# Patient Record
Sex: Female | Born: 1955 | Race: White | Hispanic: No | Marital: Married | Smoking: Never smoker
Health system: Southern US, Community
[De-identification: ages and names within clinical notes are randomized; demographics above are authoritative.]

## PROBLEM LIST (undated history)

## (undated) DIAGNOSIS — M545 Low back pain: Secondary | ICD-10-CM

## (undated) DIAGNOSIS — M1990 Unspecified osteoarthritis, unspecified site: Secondary | ICD-10-CM

## (undated) DIAGNOSIS — D649 Anemia, unspecified: Secondary | ICD-10-CM

## (undated) DIAGNOSIS — M549 Dorsalgia, unspecified: Secondary | ICD-10-CM

## (undated) DIAGNOSIS — H269 Unspecified cataract: Secondary | ICD-10-CM

## (undated) DIAGNOSIS — T7840XA Allergy, unspecified, initial encounter: Secondary | ICD-10-CM

## (undated) HISTORY — PX: BACK SURGERY: SHX140

## (undated) HISTORY — DX: Unspecified cataract: H26.9

## (undated) HISTORY — PX: TONSILLECTOMY AND ADENOIDECTOMY: SUR1326

## (undated) HISTORY — DX: Unspecified osteoarthritis, unspecified site: M19.90

## (undated) HISTORY — PX: BILATERAL OOPHORECTOMY: SHX1221

## (undated) HISTORY — DX: Anemia, unspecified: D64.9

## (undated) HISTORY — DX: Low back pain: M54.5

## (undated) HISTORY — DX: Allergy, unspecified, initial encounter: T78.40XA

## (undated) HISTORY — DX: Dorsalgia, unspecified: M54.9

---

## 2003-03-24 HISTORY — PX: ABDOMINAL HYSTERECTOMY: SHX81

## 2004-03-23 HISTORY — PX: COLONOSCOPY: SHX174

## 2009-04-08 ENCOUNTER — Ambulatory Visit: Payer: Self-pay | Admitting: Internal Medicine

## 2009-04-08 DIAGNOSIS — J309 Allergic rhinitis, unspecified: Secondary | ICD-10-CM | POA: Insufficient documentation

## 2009-04-08 DIAGNOSIS — D649 Anemia, unspecified: Secondary | ICD-10-CM

## 2009-04-08 DIAGNOSIS — M545 Low back pain, unspecified: Secondary | ICD-10-CM

## 2009-04-08 DIAGNOSIS — M1990 Unspecified osteoarthritis, unspecified site: Secondary | ICD-10-CM

## 2009-04-08 HISTORY — DX: Low back pain, unspecified: M54.50

## 2009-04-08 HISTORY — DX: Anemia, unspecified: D64.9

## 2009-04-08 HISTORY — DX: Unspecified osteoarthritis, unspecified site: M19.90

## 2009-04-08 LAB — CONVERTED CEMR LAB
Alkaline Phosphatase: 62 units/L (ref 39–117)
Basophils Relative: 0.4 % (ref 0.0–3.0)
Bilirubin, Direct: 0.1 mg/dL (ref 0.0–0.3)
CO2: 29 meq/L (ref 19–32)
Calcium: 9.3 mg/dL (ref 8.4–10.5)
Creatinine, Ser: 0.7 mg/dL (ref 0.4–1.5)
Eosinophils Absolute: 0.1 10*3/uL (ref 0.0–0.7)
GFR calc non Af Amer: 124.45 mL/min (ref >60)
HDL: 60.8 mg/dL (ref >39.00)
Hemoglobin: 12.5 g/dL — ABNORMAL LOW (ref 13.0–17.0)
LDL Cholesterol: 98 mg/dL (ref 0–99)
Lymphocytes Relative: 42.4 % (ref 12.0–46.0)
MCHC: 33 g/dL (ref 30.0–36.0)
Monocytes Relative: 7.9 % (ref 3.0–12.0)
Neutrophils Relative %: 47.2 % (ref 43.0–77.0)
RBC: 4.02 M/uL — ABNORMAL LOW (ref 4.22–5.81)
Sodium: 144 meq/L (ref 135–145)
Total CHOL/HDL Ratio: 3
Total Protein: 7.5 g/dL (ref 6.0–8.3)
Triglycerides: 38 mg/dL (ref 0.0–149.0)
VLDL: 7.6 mg/dL (ref 0.0–40.0)

## 2009-04-15 ENCOUNTER — Ambulatory Visit (HOSPITAL_COMMUNITY): Admission: RE | Admit: 2009-04-15 | Discharge: 2009-04-15 | Payer: Self-pay | Admitting: Internal Medicine

## 2010-04-16 ENCOUNTER — Ambulatory Visit (HOSPITAL_COMMUNITY)
Admission: RE | Admit: 2010-04-16 | Discharge: 2010-04-16 | Payer: Self-pay | Source: Home / Self Care | Attending: Internal Medicine | Admitting: Internal Medicine

## 2010-04-17 ENCOUNTER — Ambulatory Visit
Admission: RE | Admit: 2010-04-17 | Discharge: 2010-04-17 | Payer: Self-pay | Source: Home / Self Care | Attending: Internal Medicine | Admitting: Internal Medicine

## 2010-04-17 ENCOUNTER — Other Ambulatory Visit: Payer: Self-pay | Admitting: Internal Medicine

## 2010-04-17 LAB — CBC WITH DIFFERENTIAL/PLATELET
Basophils Relative: 0.4 % (ref 0.0–3.0)
Eosinophils Relative: 2.5 % (ref 0.0–5.0)
HCT: 36.6 % (ref 36.0–46.0)
Hemoglobin: 12.4 g/dL (ref 12.0–15.0)
MCV: 93.4 fl (ref 78.0–100.0)
Monocytes Absolute: 0.3 10*3/uL (ref 0.1–1.0)
Neutrophils Relative %: 42.7 % — ABNORMAL LOW (ref 43.0–77.0)
RBC: 3.92 Mil/uL (ref 3.87–5.11)
WBC: 3.4 10*3/uL — ABNORMAL LOW (ref 4.5–10.5)

## 2010-04-17 LAB — BASIC METABOLIC PANEL
Calcium: 9 mg/dL (ref 8.4–10.5)
GFR: 105.83 mL/min (ref >60.00)
Potassium: 4.2 mEq/L (ref 3.5–5.1)
Sodium: 139 mEq/L (ref 135–145)

## 2010-04-17 LAB — CONVERTED CEMR LAB
Bilirubin Urine: NEGATIVE
Glucose, Urine, Semiquant: NEGATIVE
Ketones, urine, test strip: NEGATIVE

## 2010-04-17 LAB — LIPID PANEL
LDL Cholesterol: 89 mg/dL (ref 0–99)
Total CHOL/HDL Ratio: 3
Triglycerides: 42 mg/dL (ref 0.0–149.0)

## 2010-04-17 LAB — HEPATIC FUNCTION PANEL
AST: 23 U/L (ref 0–37)
Alkaline Phosphatase: 71 U/L (ref 39–117)
Total Bilirubin: 0.8 mg/dL (ref 0.3–1.2)

## 2010-04-22 NOTE — Assessment & Plan Note (Signed)
Summary: TOA BE EST/PT WILL COME IN FASTING/NJR   Vital Signs:  Patient profile:   55 year old female Height:      66.5 inches Weight:      143 pounds BMI:     22.82 Pulse rate:   84 / minute Pulse rhythm:   regular BP sitting:   100 / 66  (left arm) Cuff size:   regular  Vitals Entered By: Raechel Ache, RN (April 08, 2009 9:38 AM) CC: To Establish, wants check-up.   CC:  To Establish and wants check-up.Marland Kitchen  History of Present Illness: 55 year old patient who is in today to establish with our practice.  She has enjoyed excellent health and has no concerns or complaints.  She takes no chronic medications.  Preventive Screening-Counseling & Management  Alcohol-Tobacco     Smoking Status: never  Allergies (verified): No Known Drug Allergies  Past History:  Past Medical History: Low back pain Osteoarthritis scoliosis status post surgery Anemia-NOS Allergic rhinitis history of condyloma acuminata  Past Surgical History: Harrington rod for scoliosis, 1973 Tonsillectomy 1963 gravida two, para two, abortus zero hysterectomy 2005 ('Ovarian tumor" -cervix remains) blood transfusion 1973  colonoscopy 2008 mammogram 2007  Family History: Reviewed history and no changes required. father age 50.  History BPH mother died mid-70s history of liver cancer maternal grandmother, unclear GI cancer maternal grandfather cerebral vascular and coronary artery disease  one brother one adopted sister in good health  Social History: Reviewed history and no changes required. Married Never Smoked relocated from [place] September 2010 andSmoking Status:  never  Review of Systems  The patient denies anorexia, fever, weight loss, weight gain, vision loss, decreased hearing, hoarseness, chest pain, syncope, dyspnea on exertion, peripheral edema, prolonged cough, headaches, hemoptysis, abdominal pain, melena, hematochezia, severe indigestion/heartburn, hematuria, incontinence,  genital sores, muscle weakness, suspicious skin lesions, transient blindness, difficulty walking, depression, unusual weight change, abnormal bleeding, enlarged lymph nodes, angioedema, and breast masses.    Physical Exam  General:  Well-developed,well-nourished,in no acute distress; alert,appropriate and cooperative throughout examination Head:  Normocephalic and atraumatic without obvious abnormalities. No apparent alopecia or balding. Eyes:  No corneal or conjunctival inflammation noted. EOMI. Perrla. Funduscopic exam benign, without hemorrhages, exudates or papilledema. Vision grossly normal. Ears:  External ear exam shows no significant lesions or deformities.  Otoscopic examination reveals clear canals, tympanic membranes are intact bilaterally without bulging, retraction, inflammation or discharge. Hearing is grossly normal bilaterally. Mouth:  Oral mucosa and oropharynx without lesions or exudates.  Teeth in good repair. Neck:  No deformities, masses, or tenderness noted. Chest Wall:  No deformities, masses, tenderness or gynecomastia noted. Breasts:  No masses or gynecomastia noted Lungs:  Normal respiratory effort, chest expands symmetrically. Lungs are clear to auscultation, no crackles or wheezes. Heart:  Normal rate and regular rhythm. S1 and S2 normal without gallop, murmur, click, rub or other extra sounds. Abdomen:  Bowel sounds positive,abdomen soft and non-tender without masses, organomegaly or hernias noted. Msk:  No deformity or scoliosis noted of thoracic or lumbar spine.   Pulses:  R and L carotid,radial,femoral,dorsalis pedis and posterior tibial pulses are full and equal bilaterally Extremities:  No clubbing, cyanosis, edema, or deformity noted with normal full range of motion of all joints.   Skin:  surgical scar, thoracic and lumbar spine Cervical Nodes:  No lymphadenopathy noted Axillary Nodes:  No palpable lymphadenopathy Inguinal Nodes:  No significant  adenopathy Psych:  Cognition and judgment appear intact. Alert and cooperative with normal  attention span and concentration. No apparent delusions, illusions, hallucinations   Impression & Recommendations:  Problem # 1:  ALLERGIC RHINITIS (ICD-477.9)  Orders: Venipuncture (16109) TLB-Lipid Panel (80061-LIPID) TLB-BMP (Basic Metabolic Panel-BMET) (80048-METABOL) TLB-Hepatic/Liver Function Pnl (80076-HEPATIC) TLB-TSH (Thyroid Stimulating Hormone) (84443-TSH)  Problem # 2:  ANEMIA-NOS (ICD-285.9)  Orders: Venipuncture (60454) TLB-Lipid Panel (80061-LIPID) TLB-BMP (Basic Metabolic Panel-BMET) (80048-METABOL) TLB-CBC Platelet - w/Differential (85025-CBCD) TLB-Hepatic/Liver Function Pnl (80076-HEPATIC) TLB-TSH (Thyroid Stimulating Hormone) (84443-TSH)  Problem # 3:  OSTEOARTHRITIS (ICD-715.90)  Orders: Venipuncture (09811) TLB-Lipid Panel (80061-LIPID) TLB-BMP (Basic Metabolic Panel-BMET) (80048-METABOL) TLB-Hepatic/Liver Function Pnl (80076-HEPATIC) TLB-TSH (Thyroid Stimulating Hormone) (84443-TSH)  Problem # 4:  LOW BACK PAIN (ICD-724.2)  Orders: Venipuncture (91478) TLB-Lipid Panel (80061-LIPID) TLB-BMP (Basic Metabolic Panel-BMET) (80048-METABOL) TLB-Hepatic/Liver Function Pnl (80076-HEPATIC) TLB-TSH (Thyroid Stimulating Hormone) (84443-TSH)  Complete Medication List: 1)  Famvir 250 Mg Tabs (Famciclovir) .... One twice daily for 3 days  Patient Instructions: 1)  Limit your Sodium (Salt). 2)  It is important that you exercise regularly at least 20 minutes 5 times a week. If you develop chest pain, have severe difficulty breathing, or feel very tired , stop exercising immediately and seek medical attention. 3)  Schedule your mammogram. Prescriptions: FAMVIR 250 MG TABS (FAMCICLOVIR) one twice daily for 3 days  #12 x 4   Entered and Authorized by:   Gordy Savers  MD   Signed by:   Gordy Savers  MD on 04/08/2009   Method used:   Print then Give to  Patient   RxID:   [id number]  Of the  Immunization History:  Tetanus/Td Immunization History:    Tetanus/Td:  td (03/24/2006)  Pneumovax Immunization History:    Pneumovax:  pneumovax (03/25/2007)

## 2010-05-05 ENCOUNTER — Encounter: Payer: Self-pay | Admitting: Internal Medicine

## 2010-05-06 ENCOUNTER — Ambulatory Visit (INDEPENDENT_AMBULATORY_CARE_PROVIDER_SITE_OTHER): Payer: PRIVATE HEALTH INSURANCE | Admitting: Internal Medicine

## 2010-05-06 ENCOUNTER — Encounter: Payer: Self-pay | Admitting: Internal Medicine

## 2010-05-06 ENCOUNTER — Other Ambulatory Visit (HOSPITAL_COMMUNITY)
Admission: RE | Admit: 2010-05-06 | Discharge: 2010-05-06 | Disposition: A | Discharge status: Home / Self Care | Payer: PRIVATE HEALTH INSURANCE | Source: Ambulatory Visit | Attending: Internal Medicine | Admitting: Internal Medicine

## 2010-05-06 VITALS — BP 110/66 | HR 100 | Temp 97.9°F | Resp 16 | Ht 65.5 in | Wt 148.0 lb

## 2010-05-06 DIAGNOSIS — Z0000 Encounter for general adult medical examination without abnormal findings: Secondary | ICD-10-CM

## 2010-05-06 DIAGNOSIS — Z01419 Encounter for gynecological examination (general) (routine) without abnormal findings: Secondary | ICD-10-CM | POA: Insufficient documentation

## 2010-05-06 MED ORDER — FAMCICLOVIR 250 MG PO TABS: 250.0000 mg | ORAL_TABLET | Freq: Two times a day (BID) | ORAL | Status: DC

## 2010-05-06 NOTE — Patient Instructions (Signed)
It is important that you exercise regularly, at least 20 minutes 3 to 4 times per week.  If you develop chest pain or shortness of breath seek  medical attention.  Return in one year for follow-up   

## 2010-05-06 NOTE — Progress Notes (Signed)
  Subjective:    Patient ID: Savannah Fowler, female    DOB: [birth year], 55 y.o.   MRN: [id number]  HPI  55 year old patient who is seen today for a conferences examination.  She enjoys excellent health.  She does have a history of scoliosis and is status post extensive back surgery in the past.  She does have some occasional low back pain.  She has had a partial hysterectomy and a history of cervical warts.  She is abnormal since the mid-90s.  Her last Pap was approximately 3 years ago   Review of Systems  Constitutional: Negative for fever, appetite change, fatigue and unexpected weight change.  HENT: Negative for hearing loss, ear pain, nosebleeds, congestion, sore throat, mouth sores, trouble swallowing, neck stiffness, dental problem, voice change, sinus pressure and tinnitus.   Eyes: Negative for photophobia, pain, redness and visual disturbance.  Respiratory: Negative for cough, chest tightness and shortness of breath.   Cardiovascular: Negative for chest pain, palpitations and leg swelling.  Gastrointestinal: Negative for nausea, vomiting, abdominal pain, diarrhea, constipation, blood in stool, abdominal distention and rectal pain.  Genitourinary: Negative for dysuria, urgency, frequency, hematuria, flank pain, vaginal bleeding, vaginal discharge, difficulty urinating, genital sores, vaginal pain, menstrual problem and pelvic pain.  Musculoskeletal: Positive for back pain. Negative for arthralgias.  Skin: Negative for rash.  Neurological: Negative for dizziness, syncope, speech difficulty, weakness, light-headedness, numbness and headaches.  Hematological: Negative for adenopathy. Does not bruise/bleed easily.  Psychiatric/Behavioral: Negative for suicidal ideas, behavioral problems, self-injury, dysphoric mood and agitation. The patient is not nervous/anxious.        Objective:   Physical Exam  Constitutional: She is oriented to person, place, and time. She appears well-developed and  well-nourished.  HENT:  Head: Normocephalic and atraumatic.  Right Ear: External ear normal.  Left Ear: External ear normal.  Mouth/Throat: Oropharynx is clear and moist.  Eyes: Conjunctivae and EOM are normal.  Neck: Normal range of motion. Neck supple. No JVD present. No thyromegaly present.  Cardiovascular: Normal rate, regular rhythm, normal heart sounds and intact distal pulses.   No murmur heard. Pulmonary/Chest: Effort normal and breath sounds normal. She has no wheezes. She has no rales.  Abdominal: Soft. Bowel sounds are normal. She exhibits no distension and no mass. There is no tenderness. There is no rebound and no guarding.  Genitourinary: Vagina normal. Guaiac negative stool. No vaginal discharge found.       Cervical remnant .  Pap  specimen obtained  Musculoskeletal: Normal range of motion. She exhibits no edema and no tenderness.  Neurological: She is alert and oriented to person, place, and time. She has normal reflexes. No cranial nerve deficit. She exhibits normal muscle tone. Coordination normal.  Skin: Skin is warm and dry. No rash noted.  Psychiatric: She has a normal mood and affect. Her behavior is normal.          Assessment & Plan:  Unremarkable preventive health examination

## 2010-05-14 ENCOUNTER — Ambulatory Visit (INDEPENDENT_AMBULATORY_CARE_PROVIDER_SITE_OTHER): Payer: PRIVATE HEALTH INSURANCE | Admitting: Internal Medicine

## 2010-05-14 ENCOUNTER — Encounter: Payer: Self-pay | Admitting: Internal Medicine

## 2010-05-14 VITALS — BP 118/80 | Temp 97.6°F | Ht 65.5 in | Wt 148.0 lb

## 2010-05-14 DIAGNOSIS — L259 Unspecified contact dermatitis, unspecified cause: Secondary | ICD-10-CM

## 2010-05-14 DIAGNOSIS — L309 Dermatitis, unspecified: Secondary | ICD-10-CM

## 2010-05-14 NOTE — Progress Notes (Signed)
  Subjective:    Patient ID: Savannah Fowler, female    DOB: [birth year], 55 y.o.   MRN: [id number]  HPI   55 year old patient who is seen today with a chief complaint of a erythematous rash just lateral to her right eye. She was concerned about spread of the rash to the eye itself. History is pertinent in that she has recently used a new eyeliner. She does have a history of allergic rhinitis    Review of Systems  Constitutional: Negative.   HENT: Negative.   Eyes: Negative for photophobia, pain, discharge, redness, itching and visual disturbance.        Slight erythema just lateral to the lateral border of the right eye       Objective:   Physical Exam  Constitutional: She appears well-developed.  HENT:  Head: Normocephalic and atraumatic.  Right Ear: External ear normal.  Left Ear: External ear normal.  Mouth/Throat: Oropharynx is clear and moist.  Eyes: Conjunctivae and EOM are normal. Pupils are equal, round, and reactive to light. Right eye exhibits no discharge. Left eye exhibits no discharge. No scleral icterus.        Patchy areas erythema just lateral to the lateral border of the right eye          Assessment & Plan:   facial dermatitis possibly secondary to the use of a new eyeliner. The eyeliner and all makeup will be discontinued at the present time she will apply a gentle moisturizing cream and observe. She will call if the dermatitis worsens

## 2010-05-14 NOTE — Patient Instructions (Signed)
Call or return to clinic prn if these symptoms worsen or fail to improve as anticipated. Avoid all makeup until dermatitis has resolved

## 2011-04-06 ENCOUNTER — Other Ambulatory Visit: Payer: Self-pay | Admitting: Internal Medicine

## 2011-04-06 DIAGNOSIS — Z1231 Encounter for screening mammogram for malignant neoplasm of breast: Secondary | ICD-10-CM

## 2011-04-19 ENCOUNTER — Emergency Department (HOSPITAL_COMMUNITY)
Admission: EM | Admit: 2011-04-19 | Discharge: 2011-04-19 | Disposition: A | Discharge status: Home / Self Care | Payer: 59 | Attending: Emergency Medicine | Admitting: Emergency Medicine

## 2011-04-19 DIAGNOSIS — W292XXA Contact with other powered household machinery, initial encounter: Secondary | ICD-10-CM | POA: Insufficient documentation

## 2011-04-19 DIAGNOSIS — S61213A Laceration without foreign body of left middle finger without damage to nail, initial encounter: Secondary | ICD-10-CM

## 2011-04-19 DIAGNOSIS — S61209A Unspecified open wound of unspecified finger without damage to nail, initial encounter: Secondary | ICD-10-CM | POA: Insufficient documentation

## 2011-04-19 MED ORDER — TETANUS-DIPHTH-ACELL PERTUSSIS 5-2.5-18.5 LF-MCG/0.5 IM SUSP
INTRAMUSCULAR | Status: AC
  Administered 2011-04-19: 0.5 mL via INTRAMUSCULAR
  Filled 2011-04-19: qty 0.5

## 2011-04-19 MED ORDER — TETANUS-DIPHTHERIA TOXOIDS TD 5-2 LFU IM INJ
0.5000 mL | INJECTION | Freq: Once | INTRAMUSCULAR | Status: DC
  Filled 2011-04-19: qty 0.5

## 2011-04-19 MED ORDER — HYDROCODONE-ACETAMINOPHEN 5-325 MG PO TABS: 1.0000 | ORAL_TABLET | ORAL | Status: AC | PRN

## 2011-04-19 MED ORDER — "THROMBI-PAD 3""X3"" EX PADS"
MEDICATED_PAD | CUTANEOUS | Status: AC
  Administered 2011-04-19: 15:00:00 via TOPICAL
  Filled 2011-04-19: qty 1

## 2011-04-19 NOTE — ED Provider Notes (Signed)
History     CSN: [id number]  Arrival date & time 04/19/11  1201   First MD Initiated Contact with Patient 04/19/11 1314      No chief complaint on file.   (Consider location/radiation/quality/duration/timing/severity/associated sxs/prior treatment) HPI History provided by pt.   Pt accidentally cut her left index finger on blade of food processor just pta.  C/o severe pain.  No paresthesias.  Most recent tetanus 36yrs ago.    Past Medical History  Diagnosis Date  . ANEMIA-NOS 04/08/2009  . LOW BACK PAIN 04/08/2009  . OSTEOARTHRITIS 04/08/2009  . Allergy   . Osteoarthritis   . Back pain     Past Surgical History  Procedure Date  . Cesarean section   . Back surgery   . Tonsillectomy and adenoidectomy age 36   . Bilateral oophorectomy   . Abdominal hysterectomy 2005    [place], Fibroids, cervical remnant    Family History  Problem Relation Age of Onset  . Osteoporosis Father     History  Substance Use Topics  . Smoking status: Never Smoker   . Smokeless tobacco: Not on file  . Alcohol Use: Not on file    OB History    Grav Para Term Preterm Abortions TAB SAB Ect Mult Living                  Review of Systems  All other systems reviewed and are negative.    Allergies  Review of patient's allergies indicates no known allergies.  Home Medications   Current Outpatient Rx  Name Route Sig Dispense Refill  . VITAMIN D 1000 UNITS PO TABS Oral Take 1,000 Units by mouth daily.    Marland Kitchen LORATADINE 10 MG PO TABS Oral Take 10 mg by mouth daily.    . ADULT MULTIVITAMIN W/MINERALS CH Oral Take 1 tablet by mouth daily.    Marland Kitchen VITAMIN C 500 MG PO TABS Oral Take 500 mg by mouth daily.      BP 127/65  Pulse 86  Temp(Src) 98.4 F (36.9 C) (Oral)  Resp 16  Ht 5' 6.5" (1.689 m)  Wt 154 lb (69.854 kg)  BMI 24.48 kg/m2  SpO2 100%  Physical Exam  Nursing note and vitals reviewed. Constitutional: She is oriented to person, place, and time. She appears well-developed and  well-nourished. No distress.  HENT:  Head: Normocephalic and atraumatic.  Eyes:       Normal appearance  Neck: Normal range of motion.  Musculoskeletal:       2.0cm subq laceration just distal to DIP joint of left middle finger on medial surface.  Joint can not be visualized. Hemostatic.  1.5cm superfical lac just proximal to fingernail on lateral surface of same finger.  Also hemostatic.  Nml ROM of joint.  Distal sensation intact.    Neurological: She is alert and oriented to person, place, and time.  Psychiatric: She has a normal mood and affect. Her behavior is normal.    ED Course  Procedures (including critical care time)  LACERATION REPAIR Performed by: Otilio Miu Authorized by: Otilio Miu Consent: Verbal consent obtained. Risks and benefits: risks, benefits and alternatives were discussed Consent given by: patient Patient identity confirmed: provided demographic data Prepped and Draped in normal sterile fashion Wound explored  Laceration Location: right middle finger  Laceration Length: 3.5cm  No Foreign Bodies seen or palpated  Anesthesia: digital nerve block  Local anesthetic: lidocaine 2% w/out epinephrine  Anesthetic total: 5 ml  Irrigation method: syringe  Amount of cleaning: standard  Skin closure: prolene 5.0   Number of sutures: 9  Technique: simple interrupted  Patient tolerance: Patient tolerated the procedure well with no immediate complications.  Labs Reviewed - No data to display No results found.   1. Laceration of left middle finger without foreign body without damage to nail       MDM  Pt presents w/ laceration of right middle finger.  Wound cleaned and sutured.  Ortho tech placed in finger splint.  Tetanus updated. Pt referred to PCP for suture removal.  Signs of infection discussed.   Another 1cm superficial lac on finger that I had not previously seen has started to bleed.  Located on palmar surface of right  3rd distal phalanx.  Pt holding pressure w/out improvement.  Will apply quikclot and then dermabond when bleeding has stopped.    LACERATION REPAIR Performed by: Otilio Miu Authorized by: Otilio Miu Consent: Verbal consent obtained. Risks and benefits: risks, benefits and alternatives were discussed Consent given by: patient Patient identity confirmed: provided demographic data Prepped and Draped in normal sterile fashion Wound explored  Laceration Location: palmar surface right 3rd distal phalanx  Laceration Length: 1cm  No Foreign Bodies seen or palpated  Anesthetic total: 0 ml (finger had already been blocked)  Irrigation method: syringe Amount of cleaning: standard  Skin closure: Dermabond  Patient tolerance: Patient tolerated the procedure well with no immediate complications.    Patient discharged home.  3:29 PM     Otilio Miu, PA 04/19/11 1422  Otilio Miu, [place] 04/19/11 1529

## 2011-04-19 NOTE — ED Notes (Signed)
Pt was using her food processor and accidentally and cut her left index finger. Pt has two lacerations to the finger. No active bleeding noted. Pt reports having a tetanus shot in 2005. Pt reports pain 3/10.

## 2011-04-19 NOTE — ED Provider Notes (Signed)
Medical screening examination/treatment/procedure(s) were performed by non-physician practitioner and as supervising physician I was immediately available for consultation/collaboration.   Aislynn Cifelli M Dekota Kirlin, DO 04/19/11 1922 

## 2011-04-27 ENCOUNTER — Ambulatory Visit (INDEPENDENT_AMBULATORY_CARE_PROVIDER_SITE_OTHER): Payer: PRIVATE HEALTH INSURANCE | Admitting: Internal Medicine

## 2011-04-27 ENCOUNTER — Encounter: Payer: Self-pay | Admitting: Internal Medicine

## 2011-04-27 DIAGNOSIS — Z4802 Encounter for removal of sutures: Secondary | ICD-10-CM

## 2011-04-27 DIAGNOSIS — M1990 Unspecified osteoarthritis, unspecified site: Secondary | ICD-10-CM

## 2011-04-27 NOTE — Patient Instructions (Signed)
Call or return to clinic prn if these symptoms worsen or fail to improve as anticipated.

## 2011-04-27 NOTE — Progress Notes (Signed)
  Subjective:    Patient ID: Savannah Fowler, female    DOB: 1955/04/06, 56 y.o.   MRN: [id number]  HPI  the patient had sutures placed in her left distal index finger at [facility] 8 days ago. She is here for suture removal    Review of Systems     Objective:   Physical Exam   9 sutures removed from the distal left index finger without difficulty     Assessment & Plan:  Suture removal left index finger.

## 2011-05-04 ENCOUNTER — Ambulatory Visit (HOSPITAL_COMMUNITY)
Admission: RE | Admit: 2011-05-04 | Discharge: 2011-05-04 | Disposition: A | Discharge status: Home / Self Care | Payer: PRIVATE HEALTH INSURANCE | Source: Ambulatory Visit | Attending: Internal Medicine | Admitting: Internal Medicine

## 2011-05-04 DIAGNOSIS — Z1231 Encounter for screening mammogram for malignant neoplasm of breast: Secondary | ICD-10-CM

## 2011-12-07 ENCOUNTER — Encounter: Payer: Self-pay | Admitting: Internal Medicine

## 2011-12-07 ENCOUNTER — Ambulatory Visit (INDEPENDENT_AMBULATORY_CARE_PROVIDER_SITE_OTHER): Payer: PRIVATE HEALTH INSURANCE | Admitting: Internal Medicine

## 2011-12-07 VITALS — BP 100/70 | Temp 98.2°F | Wt 164.0 lb

## 2011-12-07 DIAGNOSIS — L290 Pruritus ani: Secondary | ICD-10-CM

## 2011-12-07 MED ORDER — HYDROCORTISONE 2.5 % RE CREA: TOPICAL_CREAM | Freq: Two times a day (BID) | RECTAL | Status: DC

## 2011-12-07 NOTE — Patient Instructions (Addendum)
Anal Pruritus Anal pruritus is an itching of the anus, which is often due to increased moisture of the skin around the anus. Moisture may be due to sweating or a small amount of remaining stool. The itching and scratching can cause further skin damage.   CAUSES    Poor hygiene.   Excessive moisture from sweating or residual stool in the anal area.   Perfumed soaps and sprays and colored toilet paper.   Chemicals in the foods you eat.   Dietary factors such as caffeine, beer, milk products, chocolate, nuts, citrus fruits, tomatoes, spicy seasonings, jalapeno peppers, and salsa.   Hemorrhoids, infections, and other anal diseases.   Excessive washing.   Overuse of laxatives.   Skin disorders (psoriasis, eczema, or seborrhea).  HOME CARE INSTRUCTIONS    Practice good hygiene.   Clean the anal area gently with wet toilet paper, baby wipes, or a wet washcloth after every bowel movement and at bedtime. Avoid using soaps on the anal area. Dry the area thoroughly. Pat the area dry with toilet paper or a towel.   Do not scrub the anal area with anything, even toilet paper.   Try not to scratch the itchy area. Scratching produces more damage, which makes the itching worse.   Take sitz baths in warm water for 15 to 20 minutes, 2 to 3 times a day. Pat the area dry with a soft cloth after each bath.   Zinc oxide ointment or a moisture barrier cream can be applied several times daily to protect the skin.   Only take medicines as directed by your caregiver.   Talk to your caregiver about fiber supplements. These are helpful in normalizing the stool if you have frequent loose stools.   Wear cotton underwear and loose clothing.   Do not use irritants such as bubble baths, scented toilet paper, or genital deodorants.  SEEK MEDICAL CARE IF:    Itching does not improve in several days or gets worse.   You have a fever.   There are problems with increased pain, swelling, or redness.  MAKE  SURE YOU:    Understand these instructions.   Will watch your condition.   Will get help right away if you are not doing well or get worse.  Document Released: 09/08/2010 Document Revised: 02/26/2011 Document Reviewed: 09/08/2010 [employer] Patient Information 2012 [employer]. 

## 2011-12-07 NOTE — Progress Notes (Signed)
  Subjective:    Patient ID: Savannah Fowler, female    DOB: [birth year], 56 y.o.   MRN: [id number]  HPI  56 year old patient with a four-day history of a perirectal erythematous painful dermatitis.   Review of Systems  Skin: Positive for rash.       Objective:   Physical Exam  Skin: Rash noted.       Perirectal erythema without ulceration          Assessment & Plan:   puritus ani- local therapy discussed

## 2011-12-18 ENCOUNTER — Ambulatory Visit (INDEPENDENT_AMBULATORY_CARE_PROVIDER_SITE_OTHER): Payer: PRIVATE HEALTH INSURANCE

## 2011-12-18 DIAGNOSIS — Z23 Encounter for immunization: Secondary | ICD-10-CM

## 2012-03-28 ENCOUNTER — Other Ambulatory Visit (INDEPENDENT_AMBULATORY_CARE_PROVIDER_SITE_OTHER): Payer: No Typology Code available for payment source

## 2012-03-28 DIAGNOSIS — Z0000 Encounter for general adult medical examination without abnormal findings: Secondary | ICD-10-CM

## 2012-03-28 LAB — BASIC METABOLIC PANEL
BUN: 10 mg/dL (ref 6–23)
Chloride: 105 mEq/L (ref 96–112)
GFR: 83.09 mL/min (ref >60.00)
Glucose, Bld: 91 mg/dL (ref 70–99)
Potassium: 3.9 mEq/L (ref 3.5–5.1)
Sodium: 140 mEq/L (ref 135–145)

## 2012-03-28 LAB — CBC WITH DIFFERENTIAL/PLATELET
Basophils Absolute: 0 10*3/uL (ref 0.0–0.1)
Basophils Relative: 0.4 % (ref 0.0–3.0)
Eosinophils Absolute: 0.1 10*3/uL (ref 0.0–0.7)
Lymphocytes Relative: 44.7 % (ref 12.0–46.0)
Lymphs Abs: 1.6 10*3/uL (ref 0.7–4.0)
MCHC: 32.9 g/dL (ref 30.0–36.0)
MCV: 89.2 fl (ref 78.0–100.0)
Monocytes Absolute: 0.3 10*3/uL (ref 0.1–1.0)
Neutro Abs: 1.5 10*3/uL (ref 1.4–7.7)
RDW: 15.4 % — ABNORMAL HIGH (ref 11.5–14.6)

## 2012-03-28 LAB — HEPATIC FUNCTION PANEL
AST: 28 U/L (ref 0–37)
Alkaline Phosphatase: 71 U/L (ref 39–117)
Bilirubin, Direct: 0 mg/dL (ref 0.0–0.3)

## 2012-03-28 LAB — LIPID PANEL
Cholesterol: 163 mg/dL (ref 0–200)
HDL: 44.6 mg/dL (ref >39.00)
LDL Cholesterol: 100 mg/dL — ABNORMAL HIGH (ref 0–99)
VLDL: 18.4 mg/dL (ref 0.0–40.0)

## 2012-03-28 LAB — POCT URINALYSIS DIPSTICK
Ketones, UA: NEGATIVE
Protein, UA: NEGATIVE
Spec Grav, UA: 1.02

## 2012-03-28 LAB — TSH: TSH: 0.72 u[IU]/mL (ref 0.35–5.50)

## 2012-04-01 ENCOUNTER — Other Ambulatory Visit: Payer: Self-pay | Admitting: Internal Medicine

## 2012-04-01 DIAGNOSIS — Z1231 Encounter for screening mammogram for malignant neoplasm of breast: Secondary | ICD-10-CM

## 2012-04-04 ENCOUNTER — Encounter: Payer: Self-pay | Admitting: Internal Medicine

## 2012-04-04 ENCOUNTER — Ambulatory Visit (INDEPENDENT_AMBULATORY_CARE_PROVIDER_SITE_OTHER): Payer: No Typology Code available for payment source | Admitting: Internal Medicine

## 2012-04-04 VITALS — BP 130/72 | HR 76 | Temp 98.2°F | Resp 18 | Ht 65.0 in | Wt 167.0 lb

## 2012-04-04 DIAGNOSIS — M545 Low back pain: Secondary | ICD-10-CM

## 2012-04-04 DIAGNOSIS — Z0000 Encounter for general adult medical examination without abnormal findings: Secondary | ICD-10-CM

## 2012-04-04 MED ORDER — FAMCICLOVIR 250 MG PO TABS: 250.0000 mg | ORAL_TABLET | Freq: Two times a day (BID) | ORAL | Status: DC

## 2012-04-04 NOTE — Patient Instructions (Signed)
Take a calcium supplement, plus 800-1200 units of vitamin D    It is important that you exercise regularly, at least 20 minutes 3 to 4 times per week.  If you develop chest pain or shortness of breath seek  medical attention.  Return in one year for follow-up   

## 2012-04-04 NOTE — Progress Notes (Signed)
Subjective:    Patient ID: Savannah Fowler, female    DOB: October 24, 1955, 57 y.o.   MRN: [id number]  HPI  57 year old patient who is seen today for a health maintenance examination. She enjoys excellent health. She has a history of scoliosis and has had multiple back operations in the past. She has tolerable but chronic low back pain. She is doing quite well. No concerns or complaints. She had a screening colonoscopy at age 57. She did have a partial hysterectomy for benign disease in the last pelvic and Pap was 2 years ago.  Family history fairly noncontributory mother died at 29 of complications of liver disease or possible gallbladder. Father still living and in reasonably good health has a history of BPH one younger brother is an excellent health. One adopted sister is well  Social history nonsmoker  Past Medical History  Diagnosis Date  . ANEMIA-NOS 04/08/2009  . LOW BACK PAIN 04/08/2009  . OSTEOARTHRITIS 04/08/2009  . Allergy   . Osteoarthritis   . Back pain     History   Social History  . Marital Status: Married    Spouse Name: N/A    Number of Children: N/A  . Years of Education: N/A   Occupational History  . Not on file.   Social History Main Topics  . Smoking status: Never Smoker   . Smokeless tobacco: Not on file  . Alcohol Use: Not on file  . Drug Use: Not on file  . Sexually Active: Not on file   Other Topics Concern  . Not on file   Social History Narrative  . No narrative on file    Past Surgical History  Procedure Date  . Cesarean section   . Back surgery   . Tonsillectomy and adenoidectomy age 2   . Bilateral oophorectomy   . Abdominal hysterectomy 2005    [place], Fibroids, cervical remnant    Family History  Problem Relation Age of Onset  . Osteoporosis Father     No Known Allergies  Current Outpatient Prescriptions on File Prior to Visit  Medication Sig Dispense Refill  . Calcium Carb-Cholecalciferol (CALCIUM + D3) 600-200 MG-UNIT TABS  Take 1 tablet by mouth daily.      . hydrocortisone (ANUSOL-HC) 2.5 % rectal cream Place rectally 2 (two) times daily.  30 g  0  . loratadine (CLARITIN) 10 MG tablet Take 10 mg by mouth daily.      . Multiple Vitamin (MULITIVITAMIN WITH MINERALS) TABS Take 1 tablet by mouth daily.        BP 130/72  Pulse 76  Temp 98.2 F (36.8 C) (Oral)  Resp 18  Ht 5\' 5"  (1.651 m)  Wt 167 lb (75.751 kg)  BMI 27.79 kg/m2  SpO2 97%       Review of Systems  Constitutional: Negative for fever, appetite change, fatigue and unexpected weight change.  HENT: Negative for hearing loss, ear pain, nosebleeds, congestion, sore throat, mouth sores, trouble swallowing, neck stiffness, dental problem, voice change, sinus pressure and tinnitus.   Eyes: Negative for photophobia, pain, redness and visual disturbance.  Respiratory: Negative for cough, chest tightness and shortness of breath.   Cardiovascular: Negative for chest pain, palpitations and leg swelling.  Gastrointestinal: Negative for nausea, vomiting, abdominal pain, diarrhea, constipation, blood in stool, abdominal distention and rectal pain.  Genitourinary: Negative for dysuria, urgency, frequency, hematuria, flank pain, vaginal bleeding, vaginal discharge, difficulty urinating, genital sores, vaginal pain, menstrual problem and pelvic pain.  Musculoskeletal: Positive for  back pain. Negative for arthralgias.  Skin: Negative for rash.  Neurological: Negative for dizziness, syncope, speech difficulty, weakness, light-headedness, numbness and headaches.  Hematological: Negative for adenopathy. Does not bruise/bleed easily.  Psychiatric/Behavioral: Negative for suicidal ideas, behavioral problems, self-injury, dysphoric mood and agitation. The patient is not nervous/anxious.        Objective:   Physical Exam  Constitutional: She is oriented to person, place, and time. She appears well-developed and well-nourished.  HENT:  Head: Normocephalic and  atraumatic.  Right Ear: External ear normal.  Left Ear: External ear normal.  Mouth/Throat: Oropharynx is clear and moist.  Eyes: Conjunctivae normal and EOM are normal.  Neck: Normal range of motion. Neck supple. No JVD present. No thyromegaly present.  Cardiovascular: Normal rate, regular rhythm, normal heart sounds and intact distal pulses.   No murmur heard. Pulmonary/Chest: Effort normal and breath sounds normal. She has no wheezes. She has no rales.  Abdominal: Soft. Bowel sounds are normal. She exhibits no distension and no mass. There is no tenderness. There is no rebound and no guarding.  Musculoskeletal: Normal range of motion. She exhibits no edema and no tenderness.  Neurological: She is alert and oriented to person, place, and time. She has normal reflexes. No cranial nerve deficit. She exhibits normal muscle tone. Coordination normal.  Skin: Skin is warm and dry. No rash noted.  Psychiatric: She has a normal mood and affect. Her behavior is normal.          Assessment & Plan:   Preventive health examination Scoliosis  We'll continue calcium and vitamin D supplements  Return in one year or as needed

## 2012-05-05 ENCOUNTER — Ambulatory Visit (HOSPITAL_COMMUNITY): Payer: No Typology Code available for payment source

## 2012-05-10 ENCOUNTER — Ambulatory Visit (HOSPITAL_COMMUNITY)
Admission: RE | Admit: 2012-05-10 | Discharge: 2012-05-10 | Disposition: A | Discharge status: Home / Self Care | Payer: No Typology Code available for payment source | Source: Ambulatory Visit | Attending: Internal Medicine | Admitting: Internal Medicine

## 2012-05-10 DIAGNOSIS — Z1231 Encounter for screening mammogram for malignant neoplasm of breast: Secondary | ICD-10-CM | POA: Insufficient documentation

## 2012-07-04 ENCOUNTER — Encounter (HOSPITAL_COMMUNITY): Payer: Self-pay | Admitting: Emergency Medicine

## 2012-07-04 ENCOUNTER — Emergency Department (HOSPITAL_COMMUNITY)
Admission: EM | Admit: 2012-07-04 | Discharge: 2012-07-04 | Disposition: A | Discharge status: Home / Self Care | Payer: BC Managed Care – PPO | Attending: Emergency Medicine | Admitting: Emergency Medicine

## 2012-07-04 ENCOUNTER — Telehealth: Payer: Self-pay | Admitting: Internal Medicine

## 2012-07-04 ENCOUNTER — Emergency Department (HOSPITAL_COMMUNITY): Payer: BC Managed Care – PPO

## 2012-07-04 DIAGNOSIS — Z8739 Personal history of other diseases of the musculoskeletal system and connective tissue: Secondary | ICD-10-CM | POA: Insufficient documentation

## 2012-07-04 DIAGNOSIS — R209 Unspecified disturbances of skin sensation: Secondary | ICD-10-CM | POA: Insufficient documentation

## 2012-07-04 DIAGNOSIS — R6884 Jaw pain: Secondary | ICD-10-CM | POA: Insufficient documentation

## 2012-07-04 DIAGNOSIS — R079 Chest pain, unspecified: Secondary | ICD-10-CM | POA: Insufficient documentation

## 2012-07-04 DIAGNOSIS — Z79899 Other long term (current) drug therapy: Secondary | ICD-10-CM | POA: Insufficient documentation

## 2012-07-04 DIAGNOSIS — D649 Anemia, unspecified: Secondary | ICD-10-CM | POA: Insufficient documentation

## 2012-07-04 LAB — COMPREHENSIVE METABOLIC PANEL
ALT: 17 U/L (ref 0–35)
AST: 22 U/L (ref 0–37)
Albumin: 3.5 g/dL (ref 3.5–5.2)
Calcium: 9.1 mg/dL (ref 8.4–10.5)
Sodium: 139 mEq/L (ref 135–145)
Total Protein: 6.4 g/dL (ref 6.0–8.3)

## 2012-07-04 LAB — CBC WITH DIFFERENTIAL/PLATELET
Basophils Relative: 0 % (ref 0–1)
Eosinophils Absolute: 0.1 10*3/uL (ref 0.0–0.7)
Eosinophils Relative: 2 % (ref 0–5)
Hemoglobin: 10.4 g/dL — ABNORMAL LOW (ref 12.0–15.0)
MCH: 28.9 pg (ref 26.0–34.0)
MCV: 87.2 fL (ref 78.0–100.0)
Monocytes Relative: 9 % (ref 3–12)
Neutrophils Relative %: 49 % (ref 43–77)
Platelets: 320 10*3/uL (ref 150–400)
RBC: 3.6 MIL/uL — ABNORMAL LOW (ref 3.87–5.11)
WBC: 4.2 10*3/uL (ref 4.0–10.5)

## 2012-07-04 MED ORDER — HYDROCODONE-ACETAMINOPHEN 5-325 MG PO TABS: 1.0000 | ORAL_TABLET | Freq: Three times a day (TID) | ORAL | Status: DC | PRN

## 2012-07-04 NOTE — ED Provider Notes (Signed)
History     CSN: [id number]  Arrival date & time 07/04/12  1135   First MD Initiated Contact with Patient 07/04/12 1232      No chief complaint on file.   (Consider location/radiation/quality/duration/timing/severity/associated sxs/prior treatment) Patient is a 56 y.o. female presenting with chest pain. The history is provided by the patient (pt complains of chest pain,  jaw pain and numbness.  the pt states she has had this pain contiually for 2 days.  not affected by exirtion). No language interpreter was used.  Chest Pain Pain location:  L chest Pain quality: aching   Pain radiates to:  Does not radiate Pain radiates to the back: no   Pain severity:  Mild Onset quality:  Gradual Timing:  Constant Progression:  Unchanged Chronicity:  New Context: not breathing   Associated symptoms: no abdominal pain, no back pain, no cough, no fatigue and no headache     Past Medical History  Diagnosis Date  . ANEMIA-NOS 04/08/2009  . LOW BACK PAIN 04/08/2009  . OSTEOARTHRITIS 04/08/2009  . Allergy   . Osteoarthritis   . Back pain     Past Surgical History  Procedure Laterality Date  . Cesarean section    . Back surgery    . Tonsillectomy and adenoidectomy  age 46   . Bilateral oophorectomy    . Abdominal hysterectomy  2005    [place], Fibroids, cervical remnant    Family History  Problem Relation Age of Onset  . Osteoporosis Father     History  Substance Use Topics  . Smoking status: Never Smoker   . Smokeless tobacco: Not on file  . Alcohol Use: Yes    OB History   Grav Para Term Preterm Abortions TAB SAB Ect Mult Living                  Review of Systems  Constitutional: Negative for fatigue.  HENT: Negative for congestion, sinus pressure and ear discharge.   Eyes: Negative for discharge.  Respiratory: Negative for cough.   Cardiovascular: Positive for chest pain.  Gastrointestinal: Negative for abdominal pain and diarrhea.  Genitourinary: Negative for  frequency and hematuria.  Musculoskeletal: Negative for back pain.  Skin: Negative for rash.  Neurological: Negative for seizures and headaches.  Psychiatric/Behavioral: Negative for hallucinations.    Allergies  Review of patient's allergies indicates no known allergies.  Home Medications   Current Outpatient Rx  Name  Route  Sig  Dispense  Refill  . Calcium Carb-Cholecalciferol (CALCIUM + D3) 600-200 MG-UNIT TABS   Oral   Take 1 tablet by mouth daily.         Marland Kitchen loratadine (CLARITIN) 10 MG tablet   Oral   Take 10 mg by mouth daily.         . Multiple Vitamin (MULITIVITAMIN WITH MINERALS) TABS   Oral   Take 1 tablet by mouth daily.         Marland Kitchen HYDROcodone-acetaminophen (NORCO/VICODIN) 5-325 MG per tablet   Oral   Take 1 tablet by mouth every 8 (eight) hours as needed for pain.   15 tablet   0     BP 119/84  Pulse 97  Temp(Src) 98.1 F (36.7 C)  Resp 18  SpO2 100%  Physical Exam  Constitutional: She is oriented to person, place, and time. She appears well-developed.  HENT:  Head: Normocephalic.  Eyes: Conjunctivae and EOM are normal. No scleral icterus.  Neck: Neck supple. No thyromegaly present.  Cardiovascular:  Normal rate and regular rhythm.  Exam reveals no gallop and no friction rub.   No murmur heard. Pulmonary/Chest: No stridor. She has no wheezes. She has no rales. She exhibits no tenderness.  Abdominal: She exhibits no distension. There is no tenderness. There is no rebound.  Musculoskeletal: Normal range of motion. She exhibits no edema.  Lymphadenopathy:    She has no cervical adenopathy.  Neurological: She is oriented to person, place, and time. Coordination normal.  Skin: No rash noted. No erythema.  Psychiatric: She has a normal mood and affect. Her behavior is normal.    ED Course  Procedures (including critical care time)  Labs Reviewed  CBC WITH DIFFERENTIAL - Abnormal; Notable for the following:    RBC 3.60 (*)    Hemoglobin 10.4  (*)    HCT 31.4 (*)    All other components within normal limits  COMPREHENSIVE METABOLIC PANEL - Abnormal; Notable for the following:    Glucose, Bld 108 (*)    Total Bilirubin 0.2 (*)    All other components within normal limits  TROPONIN I   Dg Chest 2 View  07/04/2012  *RADIOLOGY REPORT*  Clinical Data: Weakness.  Body aches.  CHEST - 2 VIEW  Comparison: None.  Findings: Artifact overlies chest.  There is spinal curvature with rods in place for treatment of scoliosis.  Heart size is normal. Mediastinal shadows are normal.  Lungs are clear.  No effusions. No acute bony finding.  IMPRESSION: No active cardiopulmonary disease.  Spinal curvature with previous corrective surgery.   Original Report Authenticated By: Paulina Fusi, M.D.    Ct Head Wo Contrast  07/04/2012  *RADIOLOGY REPORT*  Clinical Data: Numbness and tingling down left arm starting 1 day ago, epigastric pressure 2 days ago  CT HEAD WITHOUT CONTRAST  Technique:  Contiguous axial images were obtained from the base of the skull through the vertex without contrast.  Comparison: None  Findings: Normal ventricular morphology. No midline shift or mass effect. Normal appearance of brain parenchyma. No intracranial hemorrhage, mass lesion, or acute infarction. Visualized paranasal sinuses and mastoid air cells clear. Bones unremarkable.  IMPRESSION: Normal exam.   Original Report Authenticated By: Ulyses Southward, M.D.      1. Chest pain   2. Anemia      Date: 07/04/2012  Rate: 73  Rhythm: normal sinus rhythm  QRS Axis: normal  Intervals: normal  ST/T Wave abnormalities: normal  Conduction Disutrbances:none  Narrative Interpretation:   Old EKG Reviewed: none available    MDM          Benny Lennert, MD 07/04/12 [phone]

## 2012-07-04 NOTE — Telephone Encounter (Signed)
Patient Information:  Caller Name: Judieth  Phone: [phone]  Patient: Savannah Fowler, Savannah Fowler  Gender: Female  DOB: Nov 24, 1955  Age: 57 Years  PCP: Eleonore Chiquito [facility])  Office Follow Up:  Does the office need to follow up with this patient?: No  Instructions For The Office: N/A  RN Note:  Triage per Numbness or Tingling Protocol and ED advised. Spoke with [facility] in the office to get ok to send.  Symptoms  Reason For Call & Symptoms: Started with numbness in L arm  and L clavicle hurting when she woke up on 07/04/12 and L lower jaw has been hurting since 07/03/12 and L arm tingling on and off yesterday. Pressure in chest on Friday night on 06/30/12- thought it was reflux- took 2 tums but wasn't able to sleep until 0500.  Reviewed Health History In EMR: Yes  Reviewed Medications In EMR: Yes  Reviewed Allergies In EMR: Yes  Reviewed Surgeries / Procedures: Yes  Date of Onset of Symptoms: 07/03/2012  Treatments Tried: Took Alleve 2 tabs and then NTG SL x1 on 07/03/12 at 1330-helped some with arm tingling and jaw pain ( husband has ntg)  Treatments Tried Worked: Yes  Guideline(s) Used:  Arm Pain  Chest Pain  Disposition Per Guideline:   Go to ED Now  Reason For Disposition Reached:   Pain also present in shoulder(s) or arm(s) or jaw  Advice Given:  Reassurance - Muscle Strain  Definition: A muscle strain occurs from over-stretching or tearing a muscle. People often call this a "pulled muscle". This muscle injury can occur while exercising, while lifting something, or sometimes during normal activities. -  Call Back If:  Moderate pain (e.g. interferes with normal activities) lasts more than 3 days  Mild pain lasts more than 7 days  You become worse.  Call Back If:  Severe chest pain  Constant chest pain lasting longer than 5 minutes  Difficulty breathing  Fever  You become worse.  Patient Will Follow Care Advice:  YES

## 2012-07-04 NOTE — ED Notes (Signed)
Pt reports feeling numbness and tingling down left arm that started yesterday. She reports epigastric pressure on Saturday that resolved in about 2 hours. Pt denies n/v/d. Pt denies difficulty walking or balance problems. Pt in NAD, skin warm and dry, pt sitting on edge of the bed answering all questions.

## 2012-07-04 NOTE — ED Notes (Signed)
Pt reports dull jaw pain at 2/10.

## 2012-07-04 NOTE — Telephone Encounter (Signed)
Pt went to ED for left side pain today.  All test were fine.  Advised pt to follow up w/pcp this week, No 30 min except a "same day" on Thursday. Pls advise.

## 2012-07-04 NOTE — Telephone Encounter (Signed)
Ok. appt made.

## 2012-07-04 NOTE — Telephone Encounter (Signed)
Noted, pt going to ED

## 2012-07-07 ENCOUNTER — Ambulatory Visit (INDEPENDENT_AMBULATORY_CARE_PROVIDER_SITE_OTHER): Payer: BC Managed Care – PPO | Admitting: Internal Medicine

## 2012-07-07 ENCOUNTER — Encounter: Payer: Self-pay | Admitting: Internal Medicine

## 2012-07-07 VITALS — BP 120/70 | HR 72 | Temp 98.1°F | Resp 18 | Wt 168.0 lb

## 2012-07-07 DIAGNOSIS — R079 Chest pain, unspecified: Secondary | ICD-10-CM

## 2012-07-07 NOTE — Patient Instructions (Signed)
Call or return to clinic prn if these symptoms worsen or fail to improve as anticipated.

## 2012-07-07 NOTE — Progress Notes (Signed)
Subjective:    Patient ID: Savannah Fowler, female    DOB: 10-31-1955, 57 y.o.   MRN: [id number]  HPI  57 year old patient who is seen today following an ED visit 3 days ago. She presented with epigastric jaw pain assessment pain involving the left arm. Evaluation included a head CT that was negative. Chest x-ray EKG were normal laboratory studies were unremarkable except for a moderate anemia following a recent blood donation. Troponin was negative. Since her discharge she has done well. She denies any exertional chest pain. She has no cardiovascular risk factors. Mother died from cancer complications and her father is still living. Siblings have no heart disease. She is a lifelong nonsmoker. Blood pressures generally in a low-normal range. No prior history of any exertional symptoms  ED records reviewed  Past Medical History  Diagnosis Date  . ANEMIA-NOS 04/08/2009  . LOW BACK PAIN 04/08/2009  . OSTEOARTHRITIS 04/08/2009  . Allergy   . Osteoarthritis   . Back pain     History   Social History  . Marital Status: Married    Spouse Name: N/A    Number of Children: N/A  . Years of Education: N/A   Occupational History  . Not on file.   Social History Main Topics  . Smoking status: Never Smoker   . Smokeless tobacco: Not on file  . Alcohol Use: Yes  . Drug Use: Not on file  . Sexually Active: Not on file   Other Topics Concern  . Not on file   Social History Narrative  . No narrative on file    Past Surgical History  Procedure Laterality Date  . Cesarean section    . Back surgery    . Tonsillectomy and adenoidectomy  age 57   . Bilateral oophorectomy    . Abdominal hysterectomy  2005    [place], Fibroids, cervical remnant    Family History  Problem Relation Age of Onset  . Osteoporosis Father     No Known Allergies  Current Outpatient Prescriptions on File Prior to Visit  Medication Sig Dispense Refill  . Calcium Carb-Cholecalciferol (CALCIUM + D3) 600-200  MG-UNIT TABS Take 1 tablet by mouth daily.      Marland Kitchen loratadine (CLARITIN) 10 MG tablet Take 10 mg by mouth daily.      . Multiple Vitamin (MULITIVITAMIN WITH MINERALS) TABS Take 1 tablet by mouth daily.      Marland Kitchen HYDROcodone-acetaminophen (NORCO/VICODIN) 5-325 MG per tablet Take 1 tablet by mouth every 8 (eight) hours as needed for pain.  15 tablet  0   No current facility-administered medications on file prior to visit.    BP 120/70  Pulse 72  Temp(Src) 98.1 F (36.7 C) (Oral)  Resp 18  Wt 168 lb (76.204 kg)  BMI 27.96 kg/m2  SpO2 98%       Review of Systems  Constitutional: Negative.   HENT: Negative for hearing loss, congestion, sore throat, rhinorrhea, dental problem, sinus pressure and tinnitus.   Eyes: Negative for pain, discharge and visual disturbance.  Respiratory: Negative for cough and shortness of breath.   Cardiovascular: Negative for chest pain, palpitations and leg swelling.  Gastrointestinal: Positive for abdominal pain. Negative for nausea, vomiting, diarrhea, constipation, blood in stool and abdominal distention.  Genitourinary: Negative for dysuria, urgency, frequency, hematuria, flank pain, vaginal bleeding, vaginal discharge, difficulty urinating, vaginal pain and pelvic pain.  Musculoskeletal: Negative for joint swelling, arthralgias and gait problem.  Skin: Negative for rash.  Neurological: Negative for dizziness,  syncope, speech difficulty, weakness, numbness and headaches.  Hematological: Negative for adenopathy.  Psychiatric/Behavioral: Negative for behavioral problems, dysphoric mood and agitation. The patient is not nervous/anxious.        Objective:   Physical Exam  Constitutional: She is oriented to person, place, and time. She appears well-developed and well-nourished.  HENT:  Head: Normocephalic.  Right Ear: External ear normal.  Left Ear: External ear normal.  Mouth/Throat: Oropharynx is clear and moist.  Eyes: Conjunctivae and EOM are normal.  Pupils are equal, round, and reactive to light.  Neck: Normal range of motion. Neck supple. No thyromegaly present.  Cardiovascular: Normal rate, regular rhythm, normal heart sounds and intact distal pulses.   Pulmonary/Chest: Effort normal and breath sounds normal.  Abdominal: Soft. Bowel sounds are normal. She exhibits no mass. There is no tenderness.  Musculoskeletal: Normal range of motion.  Lymphadenopathy:    She has no cervical adenopathy.  Neurological: She is alert and oriented to person, place, and time.  Skin: Skin is warm and dry. No rash noted.  Psychiatric: She has a normal mood and affect. Her behavior is normal.          Assessment & Plan:   Atypical chest pain. Options were discussed patient appears to be low risk. He was elected to observe at this time and consider a stress test if she has any recurrent symptoms

## 2013-04-24 ENCOUNTER — Other Ambulatory Visit: Payer: Self-pay | Admitting: Internal Medicine

## 2013-04-24 DIAGNOSIS — Z1231 Encounter for screening mammogram for malignant neoplasm of breast: Secondary | ICD-10-CM

## 2013-05-12 ENCOUNTER — Ambulatory Visit (HOSPITAL_COMMUNITY)
Admission: RE | Admit: 2013-05-12 | Discharge: 2013-05-12 | Disposition: A | Discharge status: Home / Self Care | Payer: BC Managed Care – PPO | Source: Ambulatory Visit | Attending: Internal Medicine | Admitting: Internal Medicine

## 2013-05-12 DIAGNOSIS — Z1231 Encounter for screening mammogram for malignant neoplasm of breast: Secondary | ICD-10-CM | POA: Insufficient documentation

## 2013-07-20 ENCOUNTER — Other Ambulatory Visit (INDEPENDENT_AMBULATORY_CARE_PROVIDER_SITE_OTHER): Payer: BC Managed Care – PPO

## 2013-07-20 DIAGNOSIS — Z0000 Encounter for general adult medical examination without abnormal findings: Secondary | ICD-10-CM

## 2013-07-20 LAB — HEPATIC FUNCTION PANEL
ALBUMIN: 3.8 g/dL (ref 3.5–5.2)
ALT: 19 U/L (ref 0–35)
AST: 23 U/L (ref 0–37)
Alkaline Phosphatase: 68 U/L (ref 39–117)
BILIRUBIN DIRECT: 0.1 mg/dL (ref 0.0–0.3)
TOTAL PROTEIN: 6.5 g/dL (ref 6.0–8.3)
Total Bilirubin: 0.4 mg/dL (ref 0.3–1.2)

## 2013-07-20 LAB — POCT URINALYSIS DIPSTICK
BILIRUBIN UA: NEGATIVE
Glucose, UA: NEGATIVE
Ketones, UA: NEGATIVE
NITRITE UA: NEGATIVE
PH UA: 7
RBC UA: NEGATIVE
Spec Grav, UA: 1.015
UROBILINOGEN UA: 0.2

## 2013-07-20 LAB — CBC WITH DIFFERENTIAL/PLATELET
Basophils Absolute: 0 10*3/uL (ref 0.0–0.1)
Basophils Relative: 0.3 % (ref 0.0–3.0)
EOS PCT: 1.8 % (ref 0.0–5.0)
Eosinophils Absolute: 0.1 10*3/uL (ref 0.0–0.7)
HEMATOCRIT: 38.8 % (ref 36.0–46.0)
HEMOGLOBIN: 12.9 g/dL (ref 12.0–15.0)
LYMPHS ABS: 1.6 10*3/uL (ref 0.7–4.0)
LYMPHS PCT: 44.8 % (ref 12.0–46.0)
MCHC: 33.2 g/dL (ref 30.0–36.0)
MCV: 92.2 fl (ref 78.0–100.0)
MONOS PCT: 8.2 % (ref 3.0–12.0)
Monocytes Absolute: 0.3 10*3/uL (ref 0.1–1.0)
NEUTROS ABS: 1.6 10*3/uL (ref 1.4–7.7)
Neutrophils Relative %: 44.9 % (ref 43.0–77.0)
PLATELETS: 267 10*3/uL (ref 150.0–400.0)
RBC: 4.21 Mil/uL (ref 3.87–5.11)
RDW: 14.3 % (ref 11.5–14.6)
WBC: 3.6 10*3/uL — AB (ref 4.5–10.5)

## 2013-07-20 LAB — BASIC METABOLIC PANEL
BUN: 13 mg/dL (ref 6–23)
CALCIUM: 9 mg/dL (ref 8.4–10.5)
CHLORIDE: 105 meq/L (ref 96–112)
CO2: 28 meq/L (ref 19–32)
Creatinine, Ser: 0.7 mg/dL (ref 0.4–1.2)
GFR: 94.04 mL/min (ref >60.00)
GLUCOSE: 89 mg/dL (ref 70–99)
Potassium: 4 mEq/L (ref 3.5–5.1)
SODIUM: 139 meq/L (ref 135–145)

## 2013-07-20 LAB — LIPID PANEL
CHOL/HDL RATIO: 3
Cholesterol: 143 mg/dL (ref 0–200)
HDL: 47.6 mg/dL (ref >39.00)
LDL CALC: 83 mg/dL (ref 0–99)
Triglycerides: 63 mg/dL (ref 0.0–149.0)
VLDL: 12.6 mg/dL (ref 0.0–40.0)

## 2013-07-20 LAB — TSH: TSH: 0.71 u[IU]/mL (ref 0.35–5.50)

## 2013-07-27 ENCOUNTER — Encounter: Payer: Self-pay | Admitting: Internal Medicine

## 2013-07-27 ENCOUNTER — Ambulatory Visit (INDEPENDENT_AMBULATORY_CARE_PROVIDER_SITE_OTHER): Payer: BC Managed Care – PPO | Admitting: Internal Medicine

## 2013-07-27 VITALS — BP 110/70 | HR 84 | Temp 98.2°F | Resp 20 | Ht 65.0 in | Wt 163.0 lb

## 2013-07-27 DIAGNOSIS — M1990 Unspecified osteoarthritis, unspecified site: Secondary | ICD-10-CM

## 2013-07-27 DIAGNOSIS — Z0000 Encounter for general adult medical examination without abnormal findings: Secondary | ICD-10-CM

## 2013-07-27 NOTE — Progress Notes (Signed)
   Subjective:    Patient ID: Savannah Fowler, female    DOB: 10-28-1955, 58 y.o.   MRN: [id number]  HPI  Wt Readings from Last 3 Encounters:  07/27/13 163 lb (73.936 kg)  07/07/12 168 lb (76.204 kg)  04/04/12 167 lb (75.751 kg)    Review of Systems     Objective:   Physical Exam        Assessment & Plan:

## 2013-07-27 NOTE — Progress Notes (Signed)
Pre-visit discussion using our clinic review tool. No additional management support is needed unless otherwise documented below in the visit note.  

## 2013-07-27 NOTE — Patient Instructions (Addendum)
Take a calcium supplement, plus [phone] units of vitamin D arms, or [facility] in the    It is important that you exercise regularly, at least 20 minutes 3 to 4 times per week.  If you develop chest pain or shortness of breath seek  medical attention.  Return in one year for follow-up Health Maintenance, Female A healthy lifestyle and preventative care can promote health and wellness.  Maintain regular health, dental, and eye exams.  Eat a healthy diet. Foods like vegetables, fruits, whole grains, low-fat dairy products, and lean protein foods contain the nutrients you need without too many calories. Decrease your intake of foods high in solid fats, added sugars, and salt. Get information about a proper diet from your caregiver, if necessary.  Regular physical exercise is one of the most important things you can do for your health. Most adults should get at least 150 minutes of moderate-intensity exercise (any activity that increases your heart rate and causes you to sweat) each week. In addition, most adults need muscle-strengthening exercises on 2 or more days a week.   Maintain a healthy weight. The body mass index (BMI) is a screening tool to identify possible weight problems. It provides an estimate of body fat based on height and weight. Your caregiver can help determine your BMI, and can help you achieve or maintain a healthy weight. For adults 20 years and older:  A BMI below 18.5 is considered underweight.  A BMI of 18.5 to 24.9 is normal.  A BMI of 25 to 29.9 is considered overweight.  A BMI of 30 and above is considered obese.  Maintain normal blood lipids and cholesterol by exercising and minimizing your intake of saturated fat. Eat a balanced diet with plenty of fruits and vegetables. Blood tests for lipids and cholesterol should begin at age 26 and be repeated every 5 years. If your lipid or cholesterol levels are high, you are over 50, or you are a high risk for heart  disease, you may need your cholesterol levels checked more frequently.Ongoing high lipid and cholesterol levels should be treated with medicines if diet and exercise are not effective.  If you smoke, find out from your caregiver how to quit. If you do not use tobacco, do not start.  Lung cancer screening is recommended for adults aged 42 80 years who are at high risk for developing lung cancer because of a history of smoking. Yearly low-dose computed tomography (CT) is recommended for people who have at least a 30-pack-year history of smoking and are a current smoker or have quit within the past 15 years. A pack year of smoking is smoking an average of 1 pack of cigarettes a day for 1 year (for example: 1 pack a day for 30 years or 2 packs a day for 15 years). Yearly screening should continue until the smoker has stopped smoking for at least 15 years. Yearly screening should also be stopped for people who develop a health problem that would prevent them from having lung cancer treatment.  If you are pregnant, do not drink alcohol. If you are breastfeeding, be very cautious about drinking alcohol. If you are not pregnant and choose to drink alcohol, do not exceed 1 drink per day. One drink is considered to be 12 ounces (355 mL) of beer, 5 ounces (148 mL) of wine, or 1.5 ounces (44 mL) of liquor.  Avoid use of street drugs. Do not share needles with anyone. Ask for help if you  need support or instructions about stopping the use of drugs.  High blood pressure causes heart disease and increases the risk of stroke. Blood pressure should be checked at least every 1 to 2 years. Ongoing high blood pressure should be treated with medicines, if weight loss and exercise are not effective.  If you are 74 to 59 years old, ask your caregiver if you should take aspirin to prevent strokes.  Diabetes screening involves taking a blood sample to check your fasting blood sugar level. This should be done once every 3  years, after age 25, if you are within normal weight and without risk factors for diabetes. Testing should be considered at a younger age or be carried out more frequently if you are overweight and have at least 1 risk factor for diabetes.  Breast cancer screening is essential preventative care for women. You should practice "breast self-awareness." This means understanding the normal appearance and feel of your breasts and may include breast self-examination. Any changes detected, no matter how small, should be reported to a caregiver. Women in their 33s and 30s should have a clinical breast exam (CBE) by a caregiver as part of a regular health exam every 1 to 3 years. After age 36, women should have a CBE every year. Starting at age 19, women should consider having a mammogram (breast X-ray) every year. Women who have a family history of breast cancer should talk to their caregiver about genetic screening. Women at a high risk of breast cancer should talk to their caregiver about having an MRI and a mammogram every year.  Breast cancer gene (BRCA)-related cancer risk assessment is recommended for women who have family members with BRCA-related cancers. BRCA-related cancers include breast, ovarian, tubal, and peritoneal cancers. Having family members with these cancers may be associated with an increased risk for harmful changes (mutations) in the breast cancer genes BRCA1 and BRCA2. Results of the assessment will determine the need for genetic counseling and BRCA1 and BRCA2 testing.  The Pap test is a screening test for cervical cancer. Women should have a Pap test starting at age 35. Between ages 30 and 19, Pap tests should be repeated every 2 years. Beginning at age 30, you should have a Pap test every 3 years as long as the past 3 Pap tests have been normal. If you had a hysterectomy for a problem that was not cancer or a condition that could lead to cancer, then you no longer need Pap tests. If you are  between ages 28 and 66, and you have had normal Pap tests going back 10 years, you no longer need Pap tests. If you have had past treatment for cervical cancer or a condition that could lead to cancer, you need Pap tests and screening for cancer for at least 20 years after your treatment. If Pap tests have been discontinued, risk factors (such as a new sexual partner) need to be reassessed to determine if screening should be resumed. Some women have medical problems that increase the chance of getting cervical cancer. In these cases, your caregiver may recommend more frequent screening and Pap tests.  The human papillomavirus (HPV) test is an additional test that may be used for cervical cancer screening. The HPV test looks for the virus that can cause the cell changes on the cervix. The cells collected during the Pap test can be tested for HPV. The HPV test could be used to screen women aged 69 years and older, and should be  used in women of any age who have unclear Pap test results. After the age of 3, women should have HPV testing at the same frequency as a Pap test.  Colorectal cancer can be detected and often prevented. Most routine colorectal cancer screening begins at the age of 41 and continues through age 85. However, your caregiver may recommend screening at an earlier age if you have risk factors for colon cancer. On a yearly basis, your caregiver may provide home test kits to check for hidden blood in the stool. Use of a small camera at the end of a tube, to directly examine the colon (sigmoidoscopy or colonoscopy), can detect the earliest forms of colorectal cancer. Talk to your caregiver about this at age 27, when routine screening begins. Direct examination of the colon should be repeated every 5 to 10 years through age 47, unless early forms of pre-cancerous polyps or small growths are found.  Hepatitis C blood testing is recommended for all people born from [birth year] through 1966 and any  individual with known risks for hepatitis C.  Practice safe sex. Use condoms and avoid high-risk sexual practices to reduce the spread of sexually transmitted infections (STIs). Sexually active women aged 7 and younger should be checked for Chlamydia, which is a common sexually transmitted infection. Older women with new or multiple partners should also be tested for Chlamydia. Testing for other STIs is recommended if you are sexually active and at increased risk.  Osteoporosis is a disease in which the bones lose minerals and strength with aging. This can result in serious bone fractures. The risk of osteoporosis can be identified using a bone density scan. Women ages 55 and over and women at risk for fractures or osteoporosis should discuss screening with their caregivers. Ask your caregiver whether you should be taking a calcium supplement or vitamin D to reduce the rate of osteoporosis.  Menopause can be associated with physical symptoms and risks. Hormone replacement therapy is available to decrease symptoms and risks. You should talk to your caregiver about whether hormone replacement therapy is right for you.  Use sunscreen. Apply sunscreen liberally and repeatedly throughout the day. You should seek shade when your shadow is shorter than you. Protect yourself by wearing long sleeves, pants, a wide-brimmed hat, and sunglasses year round, whenever you are outdoors.  Notify your caregiver of new moles or changes in moles, especially if there is a change in shape or color. Also notify your caregiver if a mole is larger than the size of a pencil eraser.  Stay current with your immunizations. Document Released: 09/22/2010 Document Revised: 07/04/2012 Document Reviewed: 09/22/2010 [facility] Patient Information 2014 [place].

## 2013-07-27 NOTE — Progress Notes (Signed)
Subjective:    Patient ID: Savannah Fowler, female    DOB: [birth year], 58 y.o.   MRN: [id number]  HPI 58 year old patient who is seen today for a health maintenance examination.  She enjoys excellent health. She has a history of scoliosis and has had multiple back operations in the past. She has tolerable but chronic low back pain.  Today she also complains of some fairly minor right hip pain .She is doing quite well. No concerns or complaints. She had a screening colonoscopy at age 21. She did have a partial hysterectomy for benign disease in the last pelvic and Pap was 2 years ago.  Family history fairly noncontributory mother died at 80 of complications of liver disease or possible gallbladder. Father still living and in reasonably good health has a history of BPH one younger brother is an excellent health. One adopted sister is well  Social history nonsmoker  Past Medical History  Diagnosis Date  . ANEMIA-NOS 04/08/2009  . LOW BACK PAIN 04/08/2009  . OSTEOARTHRITIS 04/08/2009  . Allergy   . Osteoarthritis   . Back pain     History   Social History  . Marital Status: Married    Spouse Name: N/A    Number of Children: N/A  . Years of Education: N/A   Occupational History  . Not on file.   Social History Main Topics  . Smoking status: Never Smoker   . Smokeless tobacco: Never Used  . Alcohol Use: 0.6 oz/week    1 Glasses of wine per week  . Drug Use: No  . Sexual Activity: Not on file   Other Topics Concern  . Not on file   Social History Narrative  . No narrative on file    Past Surgical History  Procedure Laterality Date  . Cesarean section    . Back surgery    . Tonsillectomy and adenoidectomy  age 30   . Bilateral oophorectomy    . Abdominal hysterectomy  2005    [place], Fibroids, cervical remnant    Family History  Problem Relation Age of Onset  . Osteoporosis Father     No Known Allergies  Current Outpatient Prescriptions on File Prior to Visit   Medication Sig Dispense Refill  . Calcium Carb-Cholecalciferol (CALCIUM + D3) 600-200 MG-UNIT TABS Take 1 tablet by mouth daily.      Marland Kitchen loratadine (CLARITIN) 10 MG tablet Take 10 mg by mouth daily.      . Multiple Vitamin (MULITIVITAMIN WITH MINERALS) TABS Take 1 tablet by mouth daily.       No current facility-administered medications on file prior to visit.    BP 110/70  Pulse 84  Temp(Src) 98.2 F (36.8 C) (Oral)  Resp 20  Ht 5\' 5"  (1.651 m)  Wt 163 lb (73.936 kg)  BMI 27.12 kg/m2  SpO2 98%  Results for orders placed in visit on 07/20/13  CBC WITH DIFFERENTIAL      Result Value Ref Range   WBC 3.6 (*) 4.5 - 10.5 K/uL   RBC 4.21  3.87 - 5.11 Mil/uL   Hemoglobin 12.9  12.0 - 15.0 g/dL   HCT 38.8  36.0 - 46.0 %   MCV 92.2  78.0 - 100.0 fl   MCHC 33.2  30.0 - 36.0 g/dL   RDW 14.3  11.5 - 14.6 %   Platelets 267.0  150.0 - 400.0 K/uL   Neutrophils Relative % 44.9  43.0 - 77.0 %   Lymphocytes Relative 44.8  12.0 -  46.0 %   Monocytes Relative 8.2  3.0 - 12.0 %   Eosinophils Relative 1.8  0.0 - 5.0 %   Basophils Relative 0.3  0.0 - 3.0 %   Neutro Abs 1.6  1.4 - 7.7 K/uL   Lymphs Abs 1.6  0.7 - 4.0 K/uL   Monocytes Absolute 0.3  0.1 - 1.0 K/uL   Eosinophils Absolute 0.1  0.0 - 0.7 K/uL   Basophils Absolute 0.0  0.0 - 0.1 K/uL  BASIC METABOLIC PANEL      Result Value Ref Range   Sodium 139  135 - 145 mEq/L   Potassium 4.0  3.5 - 5.1 mEq/L   Chloride 105  96 - 112 mEq/L   CO2 28  19 - 32 mEq/L   Glucose, Bld 89  70 - 99 mg/dL   BUN 13  6 - 23 mg/dL   Creatinine, Ser 0.7  0.4 - 1.2 mg/dL   Calcium 9.0  8.4 - 10.5 mg/dL   GFR 94.04  >60.00 mL/min  LIPID PANEL      Result Value Ref Range   Cholesterol 143  0 - 200 mg/dL   Triglycerides 63.0  0.0 - 149.0 mg/dL   HDL 47.60  >39.00 mg/dL   VLDL 12.6  0.0 - 40.0 mg/dL   LDL Cholesterol 83  0 - 99 mg/dL   Total CHOL/HDL Ratio 3    TSH      Result Value Ref Range   TSH 0.71  0.35 - 5.50 uIU/mL  HEPATIC FUNCTION PANEL       Result Value Ref Range   Total Bilirubin 0.4  0.3 - 1.2 mg/dL   Bilirubin, Direct 0.1  0.0 - 0.3 mg/dL   Alkaline Phosphatase 68  39 - 117 U/L   AST 23  0 - 37 U/L   ALT 19  0 - 35 U/L   Total Protein 6.5  6.0 - 8.3 g/dL   Albumin 3.8  3.5 - 5.2 g/dL  POCT URINALYSIS DIPSTICK      Result Value Ref Range   Color, UA yellow     Clarity, UA clear     Glucose, UA n     Bilirubin, UA n     Ketones, UA n     Spec Grav, UA 1.015     Blood, UA n     pH, UA 7.0     Protein, UA trace     Urobilinogen, UA 0.2     Nitrite, UA n     Leukocytes, UA moderate (2+)         Review of Systems  Constitutional: Negative for fever, appetite change, fatigue and unexpected weight change.  HENT: Negative for congestion, dental problem, ear pain, hearing loss, mouth sores, nosebleeds, sinus pressure, sore throat, tinnitus, trouble swallowing and voice change.   Eyes: Negative for photophobia, pain, redness and visual disturbance.  Respiratory: Negative for cough, chest tightness and shortness of breath.   Cardiovascular: Negative for chest pain, palpitations and leg swelling.  Gastrointestinal: Negative for nausea, vomiting, abdominal pain, diarrhea, constipation, blood in stool, abdominal distention and rectal pain.  Genitourinary: Negative for dysuria, urgency, frequency, hematuria, flank pain, vaginal bleeding, vaginal discharge, difficulty urinating, genital sores, vaginal pain, menstrual problem and pelvic pain.  Musculoskeletal: Positive for back pain. Negative for arthralgias and neck stiffness.  Skin: Negative for rash.  Neurological: Negative for dizziness, syncope, speech difficulty, weakness, light-headedness, numbness and headaches.  Hematological: Negative for adenopathy. Does not bruise/bleed easily.  Psychiatric/Behavioral: Negative for suicidal ideas, behavioral problems, self-injury, dysphoric mood and agitation. The patient is not nervous/anxious.        Objective:   Physical  Exam  Constitutional: She is oriented to person, place, and time. She appears well-developed and well-nourished.  HENT:  Head: Normocephalic and atraumatic.  Right Ear: External ear normal.  Left Ear: External ear normal.  Mouth/Throat: Oropharynx is clear and moist.  Eyes: Conjunctivae and EOM are normal.  Neck: Normal range of motion. Neck supple. No JVD present. No thyromegaly present.  Cardiovascular: Normal rate, regular rhythm, normal heart sounds and intact distal pulses.   No murmur heard. Pulmonary/Chest: Effort normal and breath sounds normal. She has no wheezes. She has no rales.  Abdominal: Soft. Bowel sounds are normal. She exhibits no distension and no mass. There is no tenderness. There is no rebound and no guarding.  Musculoskeletal: Normal range of motion. She exhibits no edema and no tenderness.  Neurological: She is alert and oriented to person, place, and time. She has normal reflexes. No cranial nerve deficit. She exhibits normal muscle tone. Coordination normal.  Skin: Skin is warm and dry. No rash noted.  Psychiatric: She has a normal mood and affect. Her behavior is normal.          Assessment & Plan:   Preventive health examination Scoliosis  We'll continue calcium and vitamin D supplements Will continue exercise program  Return in one year or as needed

## 2013-12-11 ENCOUNTER — Ambulatory Visit (INDEPENDENT_AMBULATORY_CARE_PROVIDER_SITE_OTHER): Payer: BC Managed Care – PPO | Admitting: *Deleted

## 2013-12-11 DIAGNOSIS — Z23 Encounter for immunization: Secondary | ICD-10-CM

## 2014-04-24 ENCOUNTER — Other Ambulatory Visit: Payer: Self-pay | Admitting: Internal Medicine

## 2014-04-24 DIAGNOSIS — Z1231 Encounter for screening mammogram for malignant neoplasm of breast: Secondary | ICD-10-CM

## 2014-05-22 ENCOUNTER — Ambulatory Visit (HOSPITAL_COMMUNITY)
Admission: RE | Admit: 2014-05-22 | Discharge: 2014-05-22 | Disposition: A | Discharge status: Home / Self Care | Payer: 59 | Source: Ambulatory Visit | Attending: Internal Medicine | Admitting: Internal Medicine

## 2014-05-22 DIAGNOSIS — Z1231 Encounter for screening mammogram for malignant neoplasm of breast: Secondary | ICD-10-CM | POA: Diagnosis not present

## 2014-08-09 ENCOUNTER — Other Ambulatory Visit (INDEPENDENT_AMBULATORY_CARE_PROVIDER_SITE_OTHER): Payer: 59

## 2014-08-09 DIAGNOSIS — Z0000 Encounter for general adult medical examination without abnormal findings: Secondary | ICD-10-CM | POA: Diagnosis not present

## 2014-08-09 LAB — COMPREHENSIVE METABOLIC PANEL
ALT: 18 U/L (ref 0–35)
AST: 21 U/L (ref 0–37)
Albumin: 4.2 g/dL (ref 3.5–5.2)
Alkaline Phosphatase: 72 U/L (ref 39–117)
BILIRUBIN TOTAL: 0.6 mg/dL (ref 0.2–1.2)
BUN: 13 mg/dL (ref 6–23)
CO2: 30 meq/L (ref 19–32)
CREATININE: 0.78 mg/dL (ref 0.40–1.20)
Calcium: 9.6 mg/dL (ref 8.4–10.5)
Chloride: 105 mEq/L (ref 96–112)
GFR: 79.98 mL/min (ref >60.00)
GLUCOSE: 96 mg/dL (ref 70–99)
Potassium: 3.8 mEq/L (ref 3.5–5.1)
Sodium: 141 mEq/L (ref 135–145)
Total Protein: 6.9 g/dL (ref 6.0–8.3)

## 2014-08-09 LAB — POCT URINALYSIS DIPSTICK
Bilirubin, UA: NEGATIVE
Glucose, UA: NEGATIVE
KETONES UA: NEGATIVE
LEUKOCYTES UA: NEGATIVE
NITRITE UA: NEGATIVE
PH UA: 6
PROTEIN UA: NEGATIVE
Spec Grav, UA: 1.02
UROBILINOGEN UA: 0.2

## 2014-08-09 LAB — CBC WITH DIFFERENTIAL/PLATELET
BASOS ABS: 0 10*3/uL (ref 0.0–0.1)
Basophils Relative: 0.3 % (ref 0.0–3.0)
EOS ABS: 0.1 10*3/uL (ref 0.0–0.7)
Eosinophils Relative: 2 % (ref 0.0–5.0)
HCT: 39.8 % (ref 36.0–46.0)
HEMOGLOBIN: 13.6 g/dL (ref 12.0–15.0)
LYMPHS PCT: 40.1 % (ref 12.0–46.0)
Lymphs Abs: 1.6 10*3/uL (ref 0.7–4.0)
MCHC: 34.1 g/dL (ref 30.0–36.0)
MCV: 90.5 fl (ref 78.0–100.0)
MONOS PCT: 7.6 % (ref 3.0–12.0)
Monocytes Absolute: 0.3 10*3/uL (ref 0.1–1.0)
NEUTROS ABS: 1.9 10*3/uL (ref 1.4–7.7)
NEUTROS PCT: 50 % (ref 43.0–77.0)
Platelets: 264 10*3/uL (ref 150.0–400.0)
RBC: 4.4 Mil/uL (ref 3.87–5.11)
RDW: 14.8 % (ref 11.5–15.5)
WBC: 3.9 10*3/uL — ABNORMAL LOW (ref 4.0–10.5)

## 2014-08-09 LAB — LIPID PANEL
CHOLESTEROL: 171 mg/dL (ref 0–200)
HDL: 52.9 mg/dL (ref >39.00)
LDL CALC: 104 mg/dL — AB (ref 0–99)
NonHDL: 118.1
TRIGLYCERIDES: 70 mg/dL (ref 0.0–149.0)
Total CHOL/HDL Ratio: 3
VLDL: 14 mg/dL (ref 0.0–40.0)

## 2014-08-09 LAB — TSH: TSH: 1.02 u[IU]/mL (ref 0.35–4.50)

## 2014-08-16 ENCOUNTER — Encounter: Payer: Self-pay | Admitting: Internal Medicine

## 2014-08-16 ENCOUNTER — Ambulatory Visit (INDEPENDENT_AMBULATORY_CARE_PROVIDER_SITE_OTHER): Payer: 59 | Admitting: Internal Medicine

## 2014-08-16 ENCOUNTER — Other Ambulatory Visit (HOSPITAL_COMMUNITY)
Admission: RE | Admit: 2014-08-16 | Discharge: 2014-08-16 | Disposition: A | Discharge status: Home / Self Care | Payer: 59 | Source: Ambulatory Visit | Attending: Internal Medicine | Admitting: Internal Medicine

## 2014-08-16 VITALS — BP 120/84 | HR 78 | Temp 98.1°F | Resp 18 | Ht 65.0 in | Wt 164.0 lb

## 2014-08-16 DIAGNOSIS — M545 Low back pain, unspecified: Secondary | ICD-10-CM

## 2014-08-16 DIAGNOSIS — Z01419 Encounter for gynecological examination (general) (routine) without abnormal findings: Secondary | ICD-10-CM | POA: Insufficient documentation

## 2014-08-16 DIAGNOSIS — M150 Primary generalized (osteo)arthritis: Secondary | ICD-10-CM

## 2014-08-16 DIAGNOSIS — Z0000 Encounter for general adult medical examination without abnormal findings: Secondary | ICD-10-CM

## 2014-08-16 DIAGNOSIS — M159 Polyosteoarthritis, unspecified: Secondary | ICD-10-CM

## 2014-08-16 NOTE — Patient Instructions (Addendum)
It is important that you exercise regularly, at least 20 minutes 3 to 4 times per week.  If you develop chest pain or shortness of breath seek  medical attention.  Return in one year for follow-up  Health Maintenance Adopting a healthy lifestyle and getting preventive care can go a long way to promote health and wellness. Talk with your health care provider about what schedule of regular examinations is right for you. This is a good chance for you to check in with your provider about disease prevention and staying healthy. In between checkups, there are plenty of things you can do on your own. Experts have done a lot of research about which lifestyle changes and preventive measures are most likely to keep you healthy. Ask your health care provider for more information. WEIGHT AND DIET  Eat a healthy diet  Be sure to include plenty of vegetables, fruits, low-fat dairy products, and lean protein.  Do not eat a lot of foods high in solid fats, added sugars, or salt.  Get regular exercise. This is one of the most important things you can do for your health.  Most adults should exercise for at least 150 minutes each week. The exercise should increase your heart rate and make you sweat (moderate-intensity exercise).  Most adults should also do strengthening exercises at least twice a week. This is in addition to the moderate-intensity exercise.  Maintain a healthy weight  Body mass index (BMI) is a measurement that can be used to identify possible weight problems. It estimates body fat based on height and weight. Your health care provider can help determine your BMI and help you achieve or maintain a healthy weight.  For females 20 years of age and older:   A BMI below 18.5 is considered underweight.  A BMI of 18.5 to 24.9 is normal.  A BMI of 25 to 29.9 is considered overweight.  A BMI of 30 and above is considered obese.  Watch levels of cholesterol and blood lipids  You should  start having your blood tested for lipids and cholesterol at 60 years of age, then have this test every 5 years.  You may need to have your cholesterol levels checked more often if:  Your lipid or cholesterol levels are high.  You are older than 60 years of age.  You are at high risk for heart disease.  CANCER SCREENING   Lung Cancer  Lung cancer screening is recommended for adults 55-80 years old who are at high risk for lung cancer because of a history of smoking.  A yearly low-dose CT scan of the lungs is recommended for people who:  Currently smoke.  Have quit within the past 15 years.  Have at least a 30-pack-year history of smoking. A pack year is smoking an average of one pack of cigarettes a day for 1 year.  Yearly screening should continue until it has been 15 years since you quit.  Yearly screening should stop if you develop a health problem that would prevent you from having lung cancer treatment.  Breast Cancer  Practice breast self-awareness. This means understanding how your breasts normally appear and feel.  It also means doing regular breast self-exams. Let your health care provider know about any changes, no matter how small.  If you are in your 20s or 30s, you should have a clinical breast exam (CBE) by a health care provider every 1-3 years as part of a regular health exam.  If you are 40   older, have a CBE every year. Also consider having a breast X-ray (mammogram) every year.  If you have a family history of breast cancer, talk to your health care provider about genetic screening.  If you are at high risk for breast cancer, talk to your health care provider about having an MRI and a mammogram every year.  Breast cancer gene (BRCA) assessment is recommended for women who have family members with BRCA-related cancers. BRCA-related cancers include:  Breast.  Ovarian.  Tubal.  Peritoneal cancers.  Results of the assessment will determine the need for  genetic counseling and BRCA1 and BRCA2 testing. Cervical Cancer Routine pelvic examinations to screen for cervical cancer are no longer recommended for nonpregnant women who are considered low risk for cancer of the pelvic organs (ovaries, uterus, and vagina) and who do not have symptoms. A pelvic examination may be necessary if you have symptoms including those associated with pelvic infections. Ask your health care provider if a screening pelvic exam is right for you.   The Pap test is the screening test for cervical cancer for women who are considered at risk.  If you had a hysterectomy for a problem that was not cancer or a condition that could lead to cancer, then you no longer need Pap tests.  If you are older than 65 years, and you have had normal Pap tests for the past 10 years, you no longer need to have Pap tests.  If you have had past treatment for cervical cancer or a condition that could lead to cancer, you need Pap tests and screening for cancer for at least 20 years after your treatment.  If you no longer get a Pap test, assess your risk factors if they change (such as having a new sexual partner). This can affect whether you should start being screened again.  Some women have medical problems that increase their chance of getting cervical cancer. If this is the case for you, your health care provider may recommend more frequent screening and Pap tests.  The human papillomavirus (HPV) test is another test that may be used for cervical cancer screening. The HPV test looks for the virus that can cause cell changes in the cervix. The cells collected during the Pap test can be tested for HPV.  The HPV test can be used to screen women 74 years of age and older. Getting tested for HPV can extend the interval between normal Pap tests from three to five years.  An HPV test also should be used to screen women of any age who have unclear Pap test results.  After 60 years of age, women  should have HPV testing as often as Pap tests.  Colorectal Cancer  This type of cancer can be detected and often prevented.  Routine colorectal cancer screening usually begins at 60 years of age and continues through 60 years of age.  Your health care provider may recommend screening at an earlier age if you have risk factors for colon cancer.  Your health care provider may also recommend using home test kits to check for hidden blood in the stool.  A small camera at the end of a tube can be used to examine your colon directly (sigmoidoscopy or colonoscopy). This is done to check for the earliest forms of colorectal cancer.  Routine screening usually begins at age 4.  Direct examination of the colon should be repeated every 5-10 years through 60 years of age. However, you may need to be  screened more often if early forms of precancerous polyps or small growths are found. Skin Cancer  Check your skin from head to toe regularly.  Tell your health care provider about any new moles or changes in moles, especially if there is a change in a mole's shape or color.  Also tell your health care provider if you have a mole that is larger than the size of a pencil eraser.  Always use sunscreen. Apply sunscreen liberally and repeatedly throughout the day.  Protect yourself by wearing long sleeves, pants, a wide-brimmed hat, and sunglasses whenever you are outside. HEART DISEASE, DIABETES, AND HIGH BLOOD PRESSURE   Have your blood pressure checked at least every 1-2 years. High blood pressure causes heart disease and increases the risk of stroke.  If you are between 64 years and 28 years old, ask your health care provider if you should take aspirin to prevent strokes.  Have regular diabetes screenings. This involves taking a blood sample to check your fasting blood sugar level.  If you are at a normal weight and have a low risk for diabetes, have this test once every three years after 60 years  of age.  If you are overweight and have a high risk for diabetes, consider being tested at a younger age or more often. PREVENTING INFECTION  Hepatitis B  If you have a higher risk for hepatitis B, you should be screened for this virus. You are considered at high risk for hepatitis B if:  You were born in a country where hepatitis B is common. Ask your health care provider which countries are considered high risk.  Your parents were born in a high-risk country, and you have not been immunized against hepatitis B (hepatitis B vaccine).  You have HIV or AIDS.  You use needles to inject street drugs.  You live with someone who has hepatitis B.  You have had sex with someone who has hepatitis B.  You get hemodialysis treatment.  You take certain medicines for conditions, including cancer, organ transplantation, and autoimmune conditions. Hepatitis C  Blood testing is recommended for:  Everyone born from [birth year] through 1966.  Anyone with known risk factors for hepatitis C. Sexually transmitted infections (STIs)  You should be screened for sexually transmitted infections (STIs) including gonorrhea and chlamydia if:  You are sexually active and are younger than 60 years of age.  You are older than 60 years of age and your health care provider tells you that you are at risk for this type of infection.  Your sexual activity has changed since you were last screened and you are at an increased risk for chlamydia or gonorrhea. Ask your health care provider if you are at risk.  If you do not have HIV, but are at risk, it may be recommended that you take a prescription medicine daily to prevent HIV infection. This is called pre-exposure prophylaxis (PrEP). You are considered at risk if:  You are sexually active and do not regularly use condoms or know the HIV status of your partner(s).  You take drugs by injection.  You are sexually active with a partner who has HIV. Talk with your  health care provider about whether you are at high risk of being infected with HIV. If you choose to begin PrEP, you should first be tested for HIV. You should then be tested every 3 months for as long as you are taking PrEP.  PREGNANCY   If you are premenopausal and you  may become pregnant, ask your health care provider about preconception counseling.  If you may become pregnant, take 400 to 800 micrograms (mcg) of folic acid every day.  If you want to prevent pregnancy, talk to your health care provider about birth control (contraception). OSTEOPOROSIS AND MENOPAUSE   Osteoporosis is a disease in which the bones lose minerals and strength with aging. This can result in serious bone fractures. Your risk for osteoporosis can be identified using a bone density scan.  If you are 30 years of age or older, or if you are at risk for osteoporosis and fractures, ask your health care provider if you should be screened.  Ask your health care provider whether you should take a calcium or vitamin D supplement to lower your risk for osteoporosis.  Menopause may have certain physical symptoms and risks.  Hormone replacement therapy may reduce some of these symptoms and risks. Talk to your health care provider about whether hormone replacement therapy is right for you.  HOME CARE INSTRUCTIONS   Schedule regular health, dental, and eye exams.  Stay current with your immunizations.   Do not use any tobacco products including cigarettes, chewing tobacco, or electronic cigarettes.  If you are pregnant, do not drink alcohol.  If you are breastfeeding, limit how much and how often you drink alcohol.  Limit alcohol intake to no more than 1 drink per day for nonpregnant women. One drink equals 12 ounces of beer, 5 ounces of wine, or 1 ounces of hard liquor.  Do not use street drugs.  Do not share needles.  Ask your health care provider for help if you need support or information about quitting  drugs.  Tell your health care provider if you often feel depressed.  Tell your health care provider if you have ever been abused or do not feel safe at home. Document Released: 09/22/2010 Document Revised: 07/24/2013 Document Reviewed: 02/08/2013 [facility] Patient Information 2015 [place], [place]. This information is not intended to replace advice given to you by your health care provider. Make sure you discuss any questions you have with your health care provider. Plantar Fasciitis (Heel Spur Syndrome) with Rehab The plantar fascia is a fibrous, ligament-like, soft-tissue structure that spans the bottom of the foot. Plantar fasciitis is a condition that causes pain in the foot due to inflammation of the tissue. SYMPTOMS   Pain and tenderness on the underneath side of the foot.  Pain that worsens with standing or walking. CAUSES  Plantar fasciitis is caused by irritation and injury to the plantar fascia on the underneath side of the foot. Common mechanisms of injury include:  Direct trauma to bottom of the foot.  Damage to a small nerve that runs under the foot where the main fascia attaches to the heel bone.  Stress placed on the plantar fascia due to bone spurs. RISK INCREASES WITH:   Activities that place stress on the plantar fascia (running, jumping, pivoting, or cutting).  Poor strength and flexibility.  Improperly fitted shoes.  Tight calf muscles.  Flat feet.  Failure to warm-up properly before activity.  Obesity. PREVENTION  Warm up and stretch properly before activity.  Allow for adequate recovery between workouts.  Maintain physical fitness:  Strength, flexibility, and endurance.  Cardiovascular fitness.  Maintain a health body weight.  Avoid stress on the plantar fascia.  Wear properly fitted shoes, including arch supports for individuals who have flat feet. PROGNOSIS  If treated properly, then the symptoms of plantar fasciitis usually resolve  without surgery. However, occasionally surgery is necessary. RELATED COMPLICATIONS   Recurrent symptoms that may result in a chronic condition.  Problems of the lower back that are caused by compensating for the injury, such as limping.  Pain or weakness of the foot during push-off following surgery.  Chronic inflammation, scarring, and partial or complete fascia tear, occurring more often from repeated injections. TREATMENT  Treatment initially involves the use of ice and medication to help reduce pain and inflammation. The use of strengthening and stretching exercises may help reduce pain with activity, especially stretches of the Achilles tendon. These exercises may be performed at home or with a therapist. Your caregiver may recommend that you use heel cups of arch supports to help reduce stress on the plantar fascia. Occasionally, corticosteroid injections are given to reduce inflammation. If symptoms persist for greater than 6 months despite non-surgical (conservative), then surgery may be recommended.  MEDICATION   If pain medication is necessary, then nonsteroidal anti-inflammatory medications, such as aspirin and ibuprofen, or other minor pain relievers, such as acetaminophen, are often recommended.  Do not take pain medication within 7 days before surgery.  Prescription pain relievers may be given if deemed necessary by your caregiver. Use only as directed and only as much as you need.  Corticosteroid injections may be given by your caregiver. These injections should be reserved for the most serious cases, because they may only be given a certain number of times. HEAT AND COLD  Cold treatment (icing) relieves pain and reduces inflammation. Cold treatment should be applied for 10 to 15 minutes every 2 to 3 hours for inflammation and pain and immediately after any activity that aggravates your symptoms. Use ice packs or massage the area with a piece of ice (ice massage).  Heat  treatment may be used prior to performing the stretching and strengthening activities prescribed by your caregiver, physical therapist, or athletic trainer. Use a heat pack or soak the injury in warm water. SEEK IMMEDIATE MEDICAL CARE IF:  Treatment seems to offer no benefit, or the condition worsens.  Any medications produce adverse side effects. EXERCISES RANGE OF MOTION (ROM) AND STRETCHING EXERCISES - Plantar Fasciitis (Heel Spur Syndrome) These exercises may help you when beginning to rehabilitate your injury. Your symptoms may resolve with or without further involvement from your physician, physical therapist or athletic trainer. While completing these exercises, remember:   Restoring tissue flexibility helps normal motion to return to the joints. This allows healthier, less painful movement and activity.  An effective stretch should be held for at least 30 seconds.  A stretch should never be painful. You should only feel a gentle lengthening or release in the stretched tissue. RANGE OF MOTION - Toe Extension, Flexion  Sit with your right / left leg crossed over your opposite knee.  Grasp your toes and gently pull them back toward the top of your foot. You should feel a stretch on the bottom of your toes and/or foot.  Hold this stretch for __________ seconds.  Now, gently pull your toes toward the bottom of your foot. You should feel a stretch on the top of your toes and or foot.  Hold this stretch for __________ seconds. Repeat __________ times. Complete this stretch __________ times per day.  RANGE OF MOTION - Ankle Dorsiflexion, Active Assisted  Remove shoes and sit on a chair that is preferably not on a carpeted surface.  Place right / left foot under knee. Extend your opposite leg for support.  Keeping  your heel down, slide your right / left foot back toward the chair until you feel a stretch at your ankle or calf. If you do not feel a stretch, slide your bottom forward to  the edge of the chair, while still keeping your heel down.  Hold this stretch for __________ seconds. Repeat __________ times. Complete this stretch __________ times per day.  STRETCH - Gastroc, Standing  Place hands on wall.  Extend right / left leg, keeping the front knee somewhat bent.  Slightly point your toes inward on your back foot.  Keeping your right / left heel on the floor and your knee straight, shift your weight toward the wall, not allowing your back to arch.  You should feel a gentle stretch in the right / left calf. Hold this position for __________ seconds. Repeat __________ times. Complete this stretch __________ times per day. STRETCH - Soleus, Standing  Place hands on wall.  Extend right / left leg, keeping the other knee somewhat bent.  Slightly point your toes inward on your back foot.  Keep your right / left heel on the floor, bend your back knee, and slightly shift your weight over the back leg so that you feel a gentle stretch deep in your back calf.  Hold this position for __________ seconds. Repeat __________ times. Complete this stretch __________ times per day. STRETCH - Gastrocsoleus, Standing  Note: This exercise can place a lot of stress on your foot and ankle. Please complete this exercise only if specifically instructed by your caregiver.   Place the ball of your right / left foot on a step, keeping your other foot firmly on the same step.  Hold on to the wall or a rail for balance.  Slowly lift your other foot, allowing your body weight to press your heel down over the edge of the step.  You should feel a stretch in your right / left calf.  Hold this position for __________ seconds.  Repeat this exercise with a slight bend in your right / left knee. Repeat __________ times. Complete this stretch __________ times per day.  STRENGTHENING EXERCISES - Plantar Fasciitis (Heel Spur Syndrome)  These exercises may help you when beginning to  rehabilitate your injury. They may resolve your symptoms with or without further involvement from your physician, physical therapist or athletic trainer. While completing these exercises, remember:   Muscles can gain both the endurance and the strength needed for everyday activities through controlled exercises.  Complete these exercises as instructed by your physician, physical therapist or athletic trainer. Progress the resistance and repetitions only as guided. STRENGTH - Towel Curls  Sit in a chair positioned on a non-carpeted surface.  Place your foot on a towel, keeping your heel on the floor.  Pull the towel toward your heel by only curling your toes. Keep your heel on the floor.  If instructed by your physician, physical therapist or athletic trainer, add ____________________ at the end of the towel. Repeat __________ times. Complete this exercise __________ times per day. STRENGTH - Ankle Inversion  Secure one end of a rubber exercise band/tubing to a fixed object (table, pole). Loop the other end around your foot just before your toes.  Place your fists between your knees. This will focus your strengthening at your ankle.  Slowly, pull your big toe up and in, making sure the band/tubing is positioned to resist the entire motion.  Hold this position for __________ seconds.  Have your muscles resist the band/tubing  as it slowly pulls your foot back to the starting position. Repeat __________ times. Complete this exercises __________ times per day.  Document Released: 03/09/2005 Document Revised: 06/01/2011 Document Reviewed: 06/21/2008 [facility] Patient Information 2015 [place], [place]. This information is not intended to replace advice given to you by your health care provider. Make sure you discuss any questions you have with your health care provider.

## 2014-08-16 NOTE — Progress Notes (Signed)
Pre visit review using our clinic review tool, if applicable. No additional management support is needed unless otherwise documented below in the visit note. 

## 2014-08-16 NOTE — Progress Notes (Signed)
Subjective:    Patient ID: Savannah Fowler, female    DOB: 02/13/1956, 59 y.o.   MRN: [id number]  HPI 34 -year-old patient who is seen today for a health maintenance examination.  She enjoys excellent health. She has a history of scoliosis and has had multiple back operations in the past. She has tolerable but chronic low back pain.  Today she also complains of some fairly minor right hip pain .She is doing quite well. No concerns or complaints. She had a screening colonoscopy at age 48. She did have a partial hysterectomy for benign disease in the last pelvic and Pap was 2 years ago.  Family history fairly noncontributory mother died at 27 of complications of liver disease or possible gallbladder. Father still living and in reasonably good health has a history of BPH one younger brother is an excellent health. One adopted sister is well  Social history nonsmoker  Past Medical History  Diagnosis Date  . ANEMIA-NOS 04/08/2009  . LOW BACK PAIN 04/08/2009  . OSTEOARTHRITIS 04/08/2009  . Allergy   . Osteoarthritis   . Back pain     History   Social History  . Marital Status: Married    Spouse Name: N/A  . Number of Children: N/A  . Years of Education: N/A   Occupational History  . Not on file.   Social History Main Topics  . Smoking status: Never Smoker   . Smokeless tobacco: Never Used  . Alcohol Use: 0.6 oz/week    1 Glasses of wine per week  . Drug Use: No  . Sexual Activity: Not on file   Other Topics Concern  . Not on file   Social History Narrative    Past Surgical History  Procedure Laterality Date  . Cesarean section    . Back surgery    . Tonsillectomy and adenoidectomy  age 69   . Bilateral oophorectomy    . Abdominal hysterectomy  2005    [place], Fibroids, cervical remnant    Family History  Problem Relation Age of Onset  . Osteoporosis Father     No Known Allergies  Current Outpatient Prescriptions on File Prior to Visit  Medication Sig  Dispense Refill  . Calcium Carb-Cholecalciferol (CALCIUM + D3) 600-200 MG-UNIT TABS Take 1 tablet by mouth daily.    Marland Kitchen ibuprofen (ADVIL) 200 MG tablet Take 200 mg by mouth every 6 (six) hours as needed.    . loratadine (CLARITIN) 10 MG tablet Take 10 mg by mouth daily.    . Multiple Vitamin (MULITIVITAMIN WITH MINERALS) TABS Take 1 tablet by mouth daily.    . naproxen sodium (ALEVE) 220 MG tablet Take 220 mg by mouth as needed.     No current facility-administered medications on file prior to visit.    BP 120/84 mmHg  Pulse 78  Temp(Src) 98.1 F (36.7 C) (Oral)  Resp 18  Ht 5\' 5"  (1.651 m)  Wt 164 lb (74.39 kg)  BMI 27.29 kg/m2  SpO2 98%  Results for orders placed or performed in visit on 08/09/14  CBC with Differential/Platelet  Result Value Ref Range   WBC 3.9 (L) 4.0 - 10.5 K/uL   RBC 4.40 3.87 - 5.11 Mil/uL   Hemoglobin 13.6 12.0 - 15.0 g/dL   HCT 39.8 36.0 - 46.0 %   MCV 90.5 78.0 - 100.0 fl   MCHC 34.1 30.0 - 36.0 g/dL   RDW 14.8 11.5 - 15.5 %   Platelets 264.0 150.0 - 400.0 K/uL  Neutrophils Relative % 50.0 43.0 - 77.0 %   Lymphocytes Relative 40.1 12.0 - 46.0 %   Monocytes Relative 7.6 3.0 - 12.0 %   Eosinophils Relative 2.0 0.0 - 5.0 %   Basophils Relative 0.3 0.0 - 3.0 %   Neutro Abs 1.9 1.4 - 7.7 K/uL   Lymphs Abs 1.6 0.7 - 4.0 K/uL   Monocytes Absolute 0.3 0.1 - 1.0 K/uL   Eosinophils Absolute 0.1 0.0 - 0.7 K/uL   Basophils Absolute 0.0 0.0 - 0.1 K/uL  Comprehensive metabolic panel  Result Value Ref Range   Sodium 141 135 - 145 mEq/L   Potassium 3.8 3.5 - 5.1 mEq/L   Chloride 105 96 - 112 mEq/L   CO2 30 19 - 32 mEq/L   Glucose, Bld 96 70 - 99 mg/dL   BUN 13 6 - 23 mg/dL   Creatinine, Ser 0.78 0.40 - 1.20 mg/dL   Total Bilirubin 0.6 0.2 - 1.2 mg/dL   Alkaline Phosphatase 72 39 - 117 U/L   AST 21 0 - 37 U/L   ALT 18 0 - 35 U/L   Total Protein 6.9 6.0 - 8.3 g/dL   Albumin 4.2 3.5 - 5.2 g/dL   Calcium 9.6 8.4 - 10.5 mg/dL   GFR 79.98 >60.00 mL/min   Lipid panel  Result Value Ref Range   Cholesterol 171 0 - 200 mg/dL   Triglycerides 70.0 0.0 - 149.0 mg/dL   HDL 52.90 >39.00 mg/dL   VLDL 14.0 0.0 - 40.0 mg/dL   LDL Cholesterol 104 (H) 0 - 99 mg/dL   Total CHOL/HDL Ratio 3    NonHDL 118.10   TSH  Result Value Ref Range   TSH 1.02 0.35 - 4.50 uIU/mL  POCT urinalysis dipstick  Result Value Ref Range   Color, UA yellow    Clarity, UA clear    Glucose, UA n    Bilirubin, UA n    Ketones, UA n    Spec Grav, UA 1.020    Blood, UA trace intact    pH, UA 6.0    Protein, UA n    Urobilinogen, UA 0.2    Nitrite, UA n    Leukocytes, UA Negative        Review of Systems  Constitutional: Negative for fever, appetite change, fatigue and unexpected weight change.  HENT: Negative for congestion, dental problem, ear pain, hearing loss, mouth sores, nosebleeds, sinus pressure, sore throat, tinnitus, trouble swallowing and voice change.   Eyes: Negative for photophobia, pain, redness and visual disturbance.  Respiratory: Negative for cough, chest tightness and shortness of breath.   Cardiovascular: Negative for chest pain, palpitations and leg swelling.  Gastrointestinal: Negative for nausea, vomiting, abdominal pain, diarrhea, constipation, blood in stool, abdominal distention and rectal pain.  Genitourinary: Negative for dysuria, urgency, frequency, hematuria, flank pain, vaginal bleeding, vaginal discharge, difficulty urinating, genital sores, vaginal pain, menstrual problem and pelvic pain.  Musculoskeletal: Positive for back pain. Negative for arthralgias and neck stiffness.  Skin: Negative for rash.  Neurological: Negative for dizziness, syncope, speech difficulty, weakness, light-headedness, numbness and headaches.  Hematological: Negative for adenopathy. Does not bruise/bleed easily.  Psychiatric/Behavioral: Negative for suicidal ideas, behavioral problems, self-injury, dysphoric mood and agitation. The patient is not  nervous/anxious.        Objective:   Physical Exam  Constitutional: She is oriented to person, place, and time. She appears well-developed and well-nourished.  HENT:  Head: Normocephalic and atraumatic.  Right Ear: External ear normal.  Left Ear: External ear normal.  Mouth/Throat: Oropharynx is clear and moist.  Eyes: Conjunctivae and EOM are normal.  Neck: Normal range of motion. Neck supple. No JVD present. No thyromegaly present.  Cardiovascular: Normal rate, regular rhythm, normal heart sounds and intact distal pulses.   No murmur heard. Pulmonary/Chest: Effort normal and breath sounds normal. She has no wheezes. She has no rales.  Abdominal: Soft. Bowel sounds are normal. She exhibits no distension and no mass. There is no tenderness. There is no rebound and no guarding.  Musculoskeletal: Normal range of motion. She exhibits no edema or tenderness.  Neurological: She is alert and oriented to person, place, and time. She has normal reflexes. No cranial nerve deficit. She exhibits normal muscle tone. Coordination normal.  Skin: Skin is warm and dry. No rash noted.  Psychiatric: She has a normal mood and affect. Her behavior is normal.          Assessment & Plan:   Preventive health examination Scoliosis  We'll continue calcium and vitamin D supplements Will continue exercise program  Return in one year or as needed 10 year colonoscopy ordered

## 2014-08-17 ENCOUNTER — Encounter: Payer: Self-pay | Admitting: Physician Assistant

## 2014-08-21 LAB — CYTOLOGY - PAP

## 2014-08-31 ENCOUNTER — Encounter: Payer: Self-pay | Admitting: Internal Medicine

## 2014-10-27 IMAGING — CT CT HEAD W/O CM
2 series · 16 of 30 positions shown, 20 images · non-contrast
Comparison: None

CLINICAL DATA: Numbness and tingling down left arm starting 1 day
ago, epigastric pressure 2 days ago

CT HEAD WITHOUT CONTRAST
TECHNIQUE: Contiguous axial images were obtained from the base of
the skull through the vertex without contrast.

[Series 2: head w/o · axial · non-contrast · 0.43mm/px · z∈[-132,-12]mm · 13 of 30 slices shown, 17 images]
[im 3/30  brain]
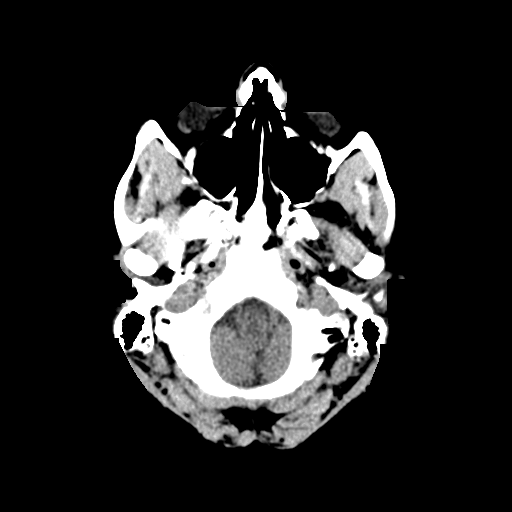
[im 3/30  bone]
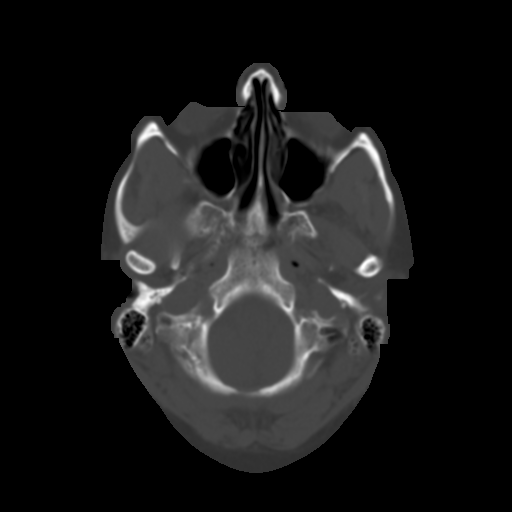
[im 5/30  brain]
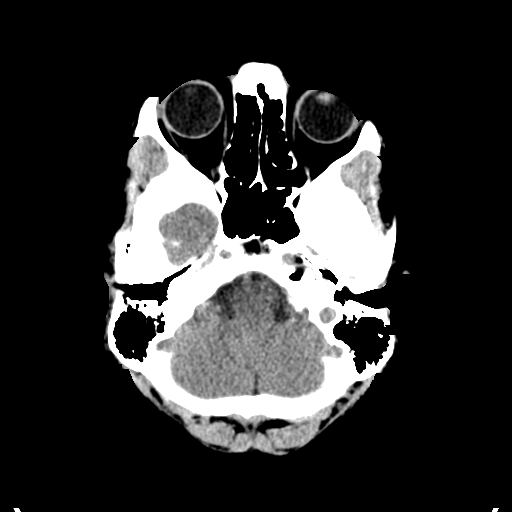
[im 7/30  brain]
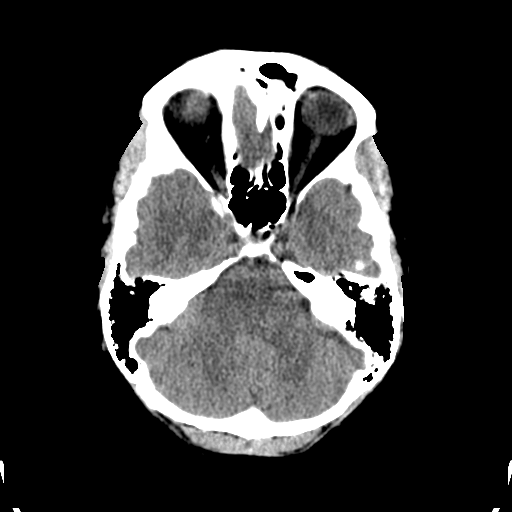
[im 9/30  brain]
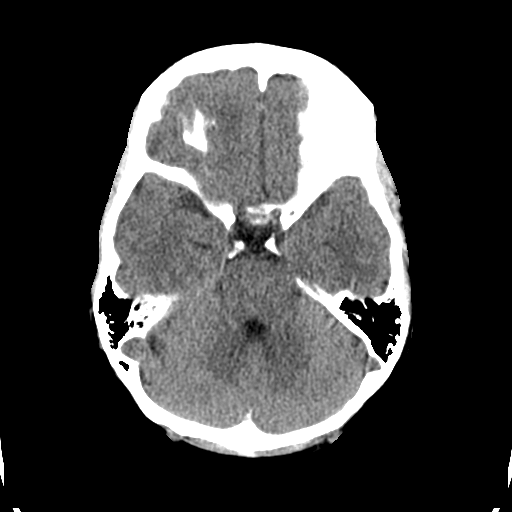
[im 11/30  brain]
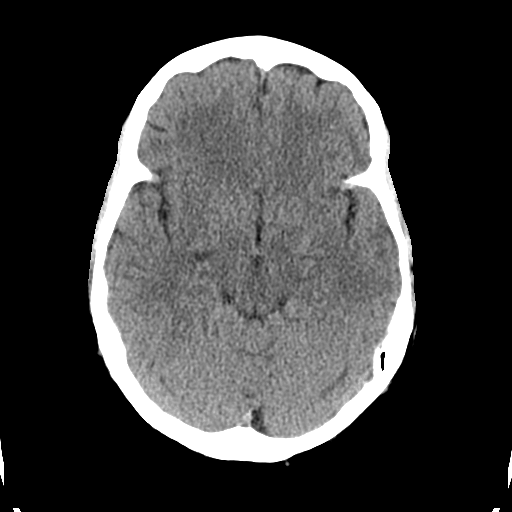
[im 11/30  bone]
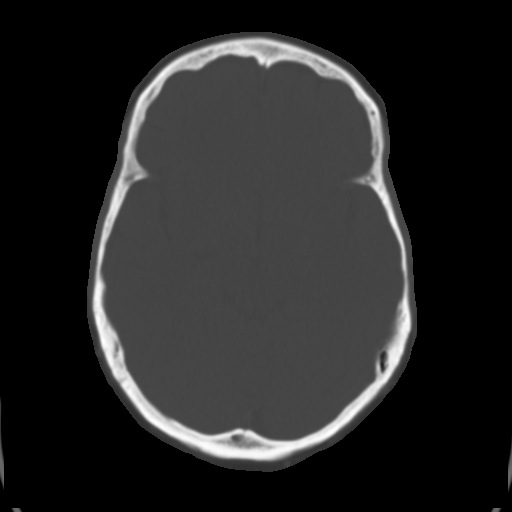
[im 13/30  brain]
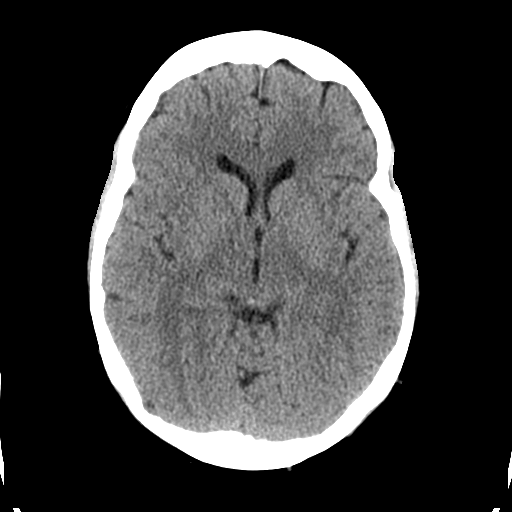
[im 15/30  brain]
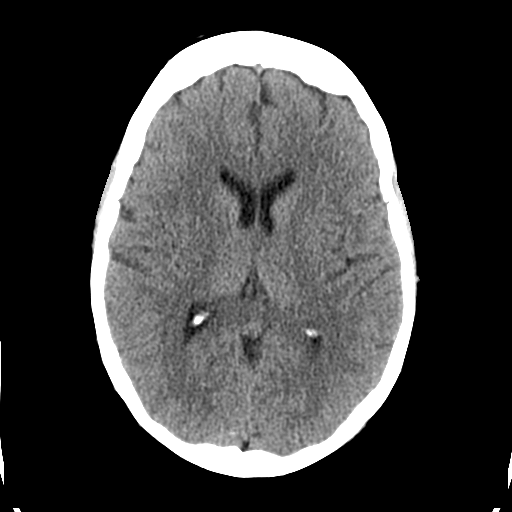
[im 17/30  brain]
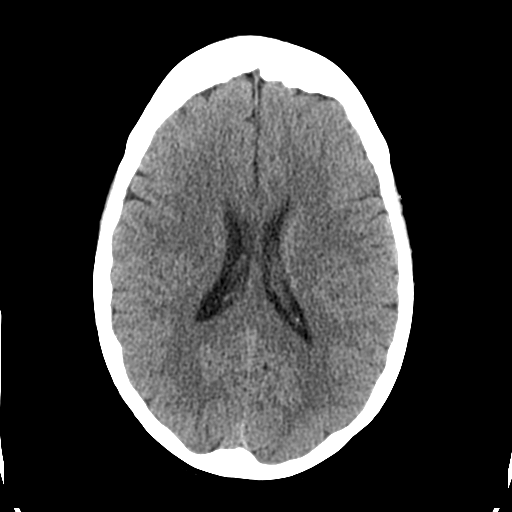
[im 19/30  brain]
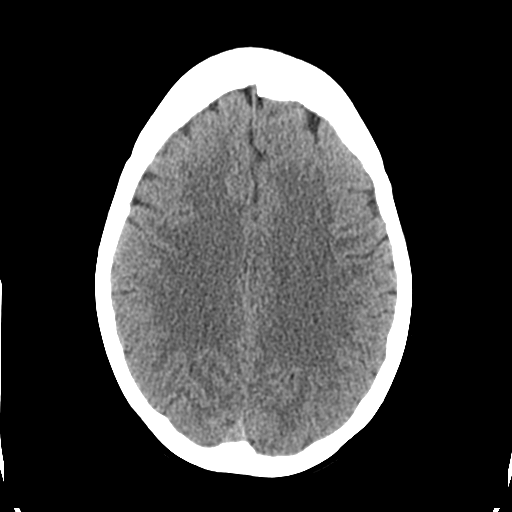
[im 19/30  bone]
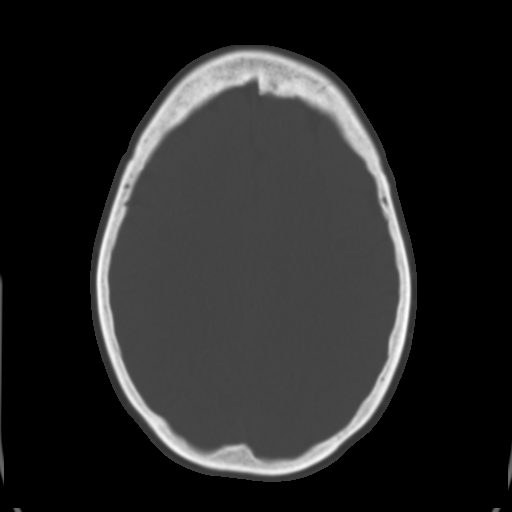
[im 21/30  brain]
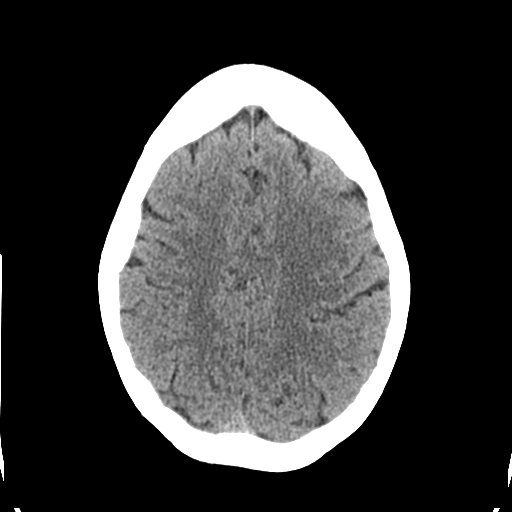
[im 23/30  brain]
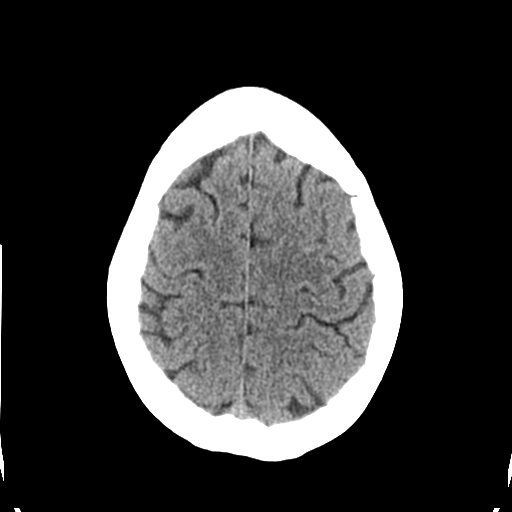
[im 25/30  brain]
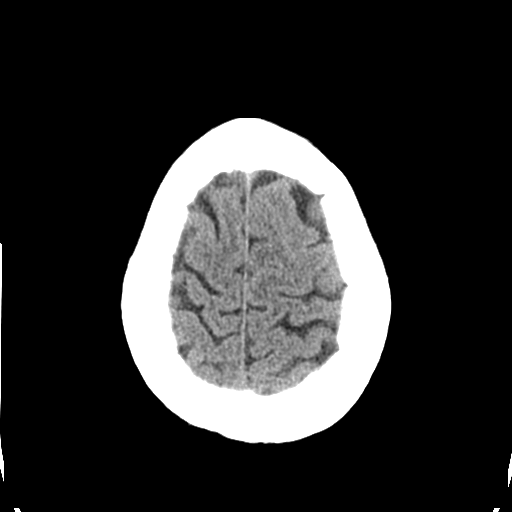
[im 27/30  brain]
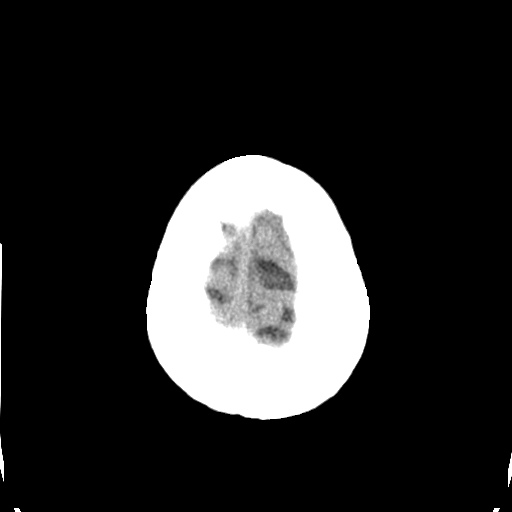
[im 27/30  bone]
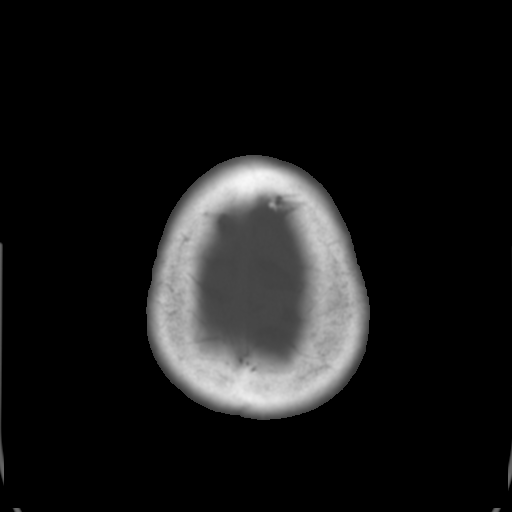

[Series 3: bone windows · axial · 0.43mm/px · z∈[-132,-92]mm · 3 of 30 slices shown]
[im 3/30  bone]
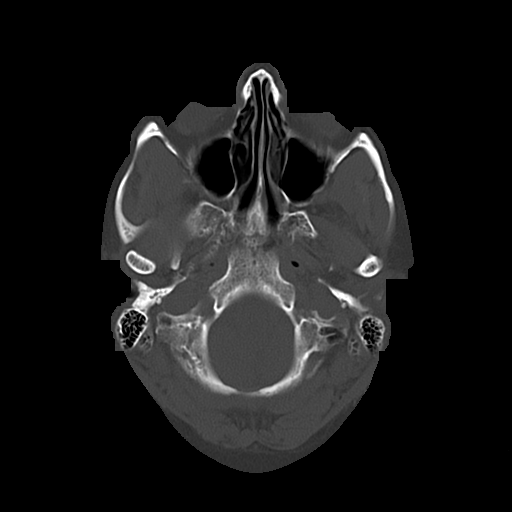
[im 7/30  bone]
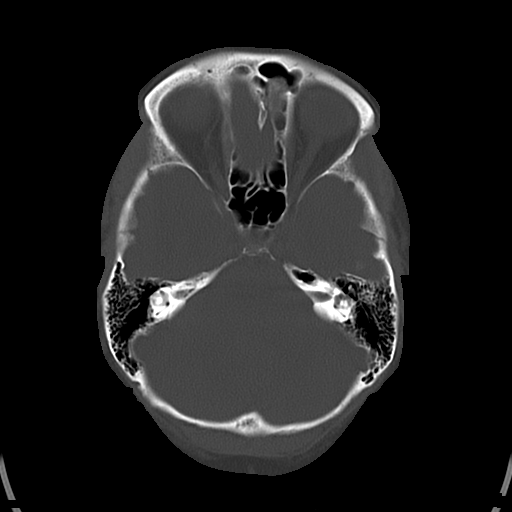
[im 11/30  bone]
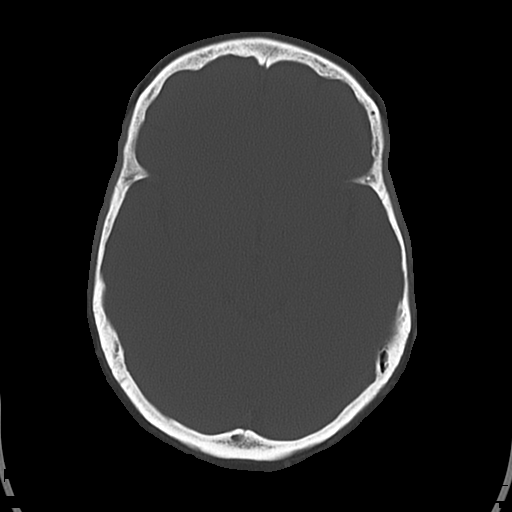

[16 of 30 positions shown; findings below may reference images not displayed]

FINDINGS: Normal ventricular morphology.
No midline shift or mass effect.
Normal appearance of brain parenchyma.
No intracranial hemorrhage, mass lesion, or acute infarction.
Visualized paranasal sinuses and mastoid air cells clear.
Bones unremarkable.
IMPRESSION: Normal exam.

## 2014-10-27 IMAGING — CR DG CHEST 2V
2 series · 2 of 2 positions shown · non-contrast
Comparison: None.

CLINICAL DATA: Weakness.  Body aches.

CHEST - 2 VIEW

[w chest pa]
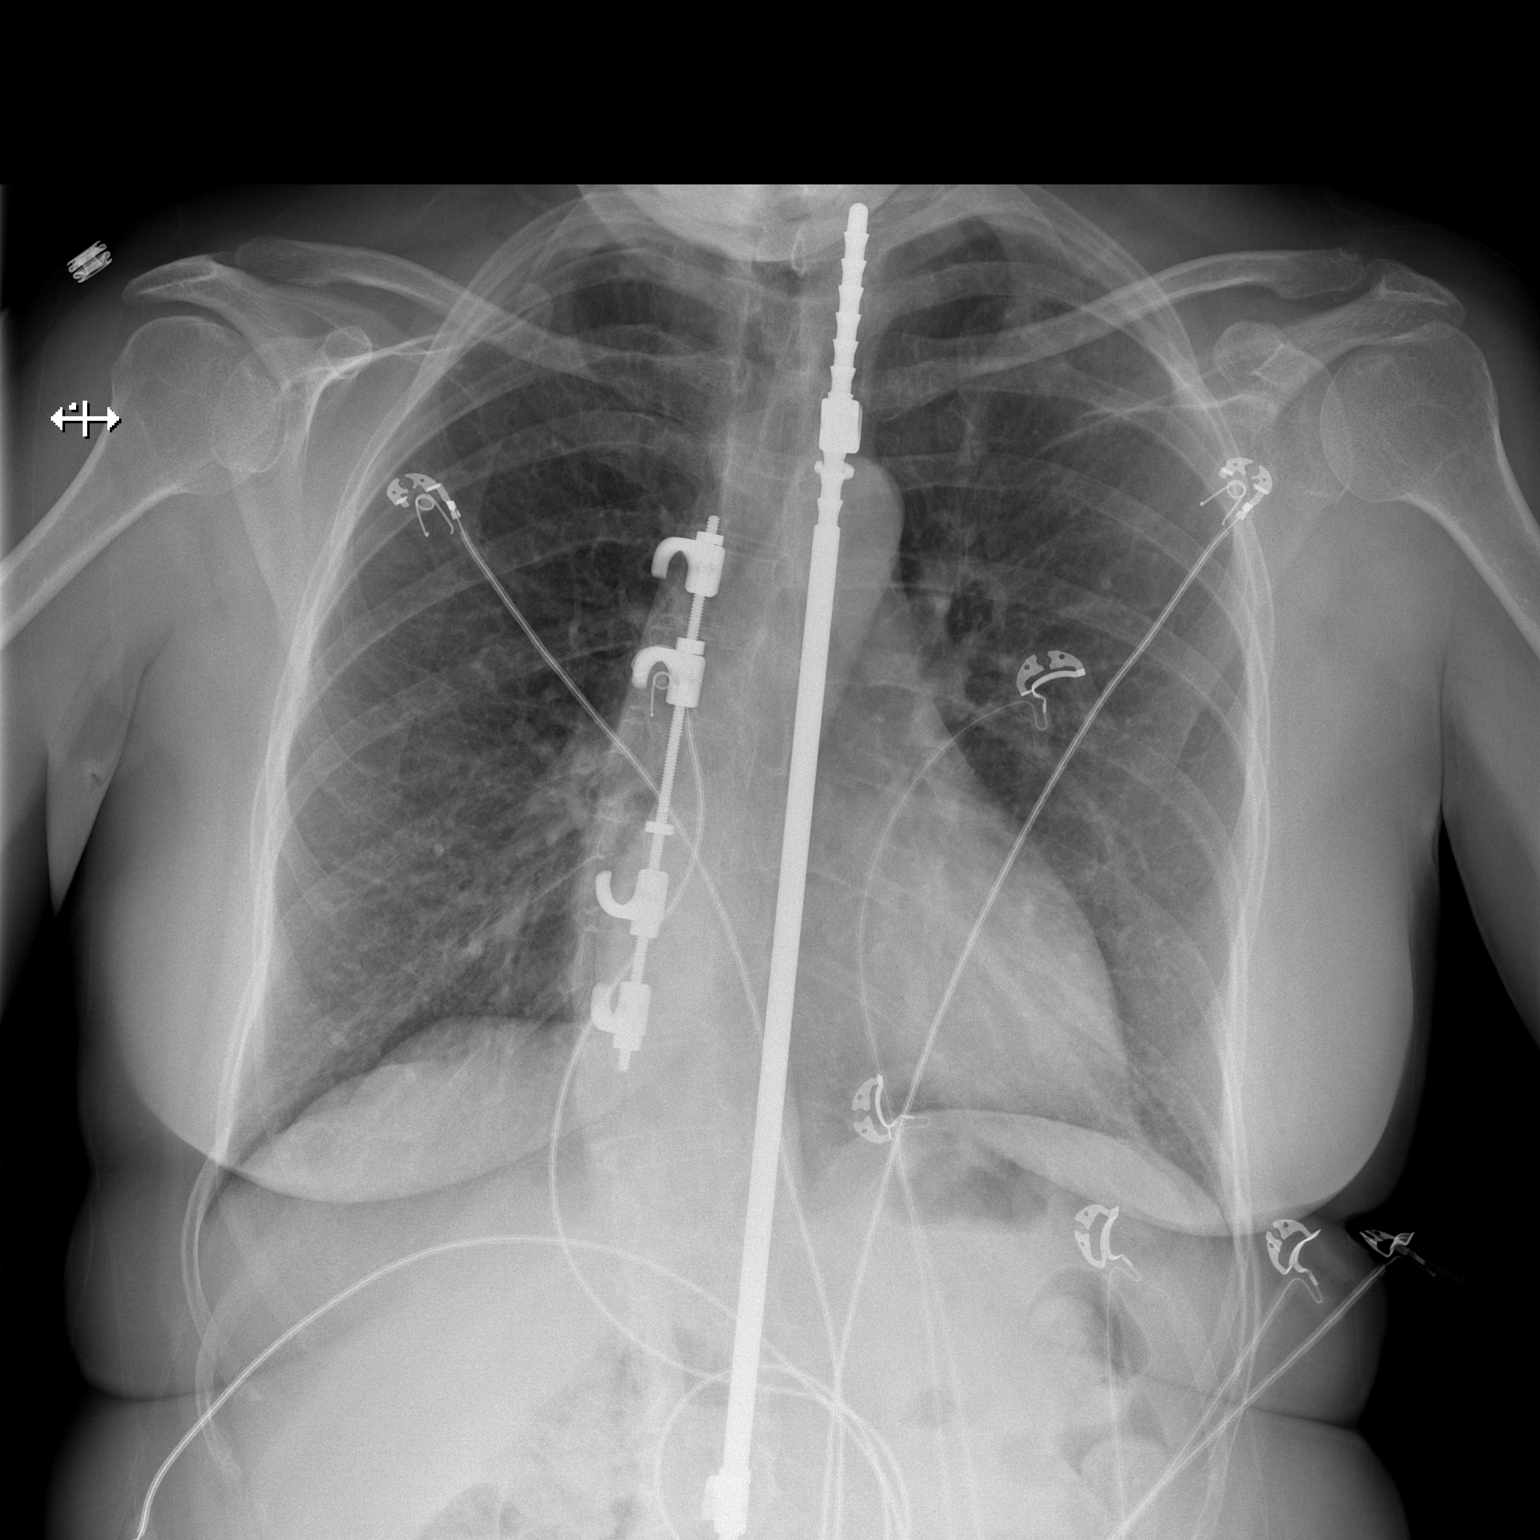

[w chest lat]
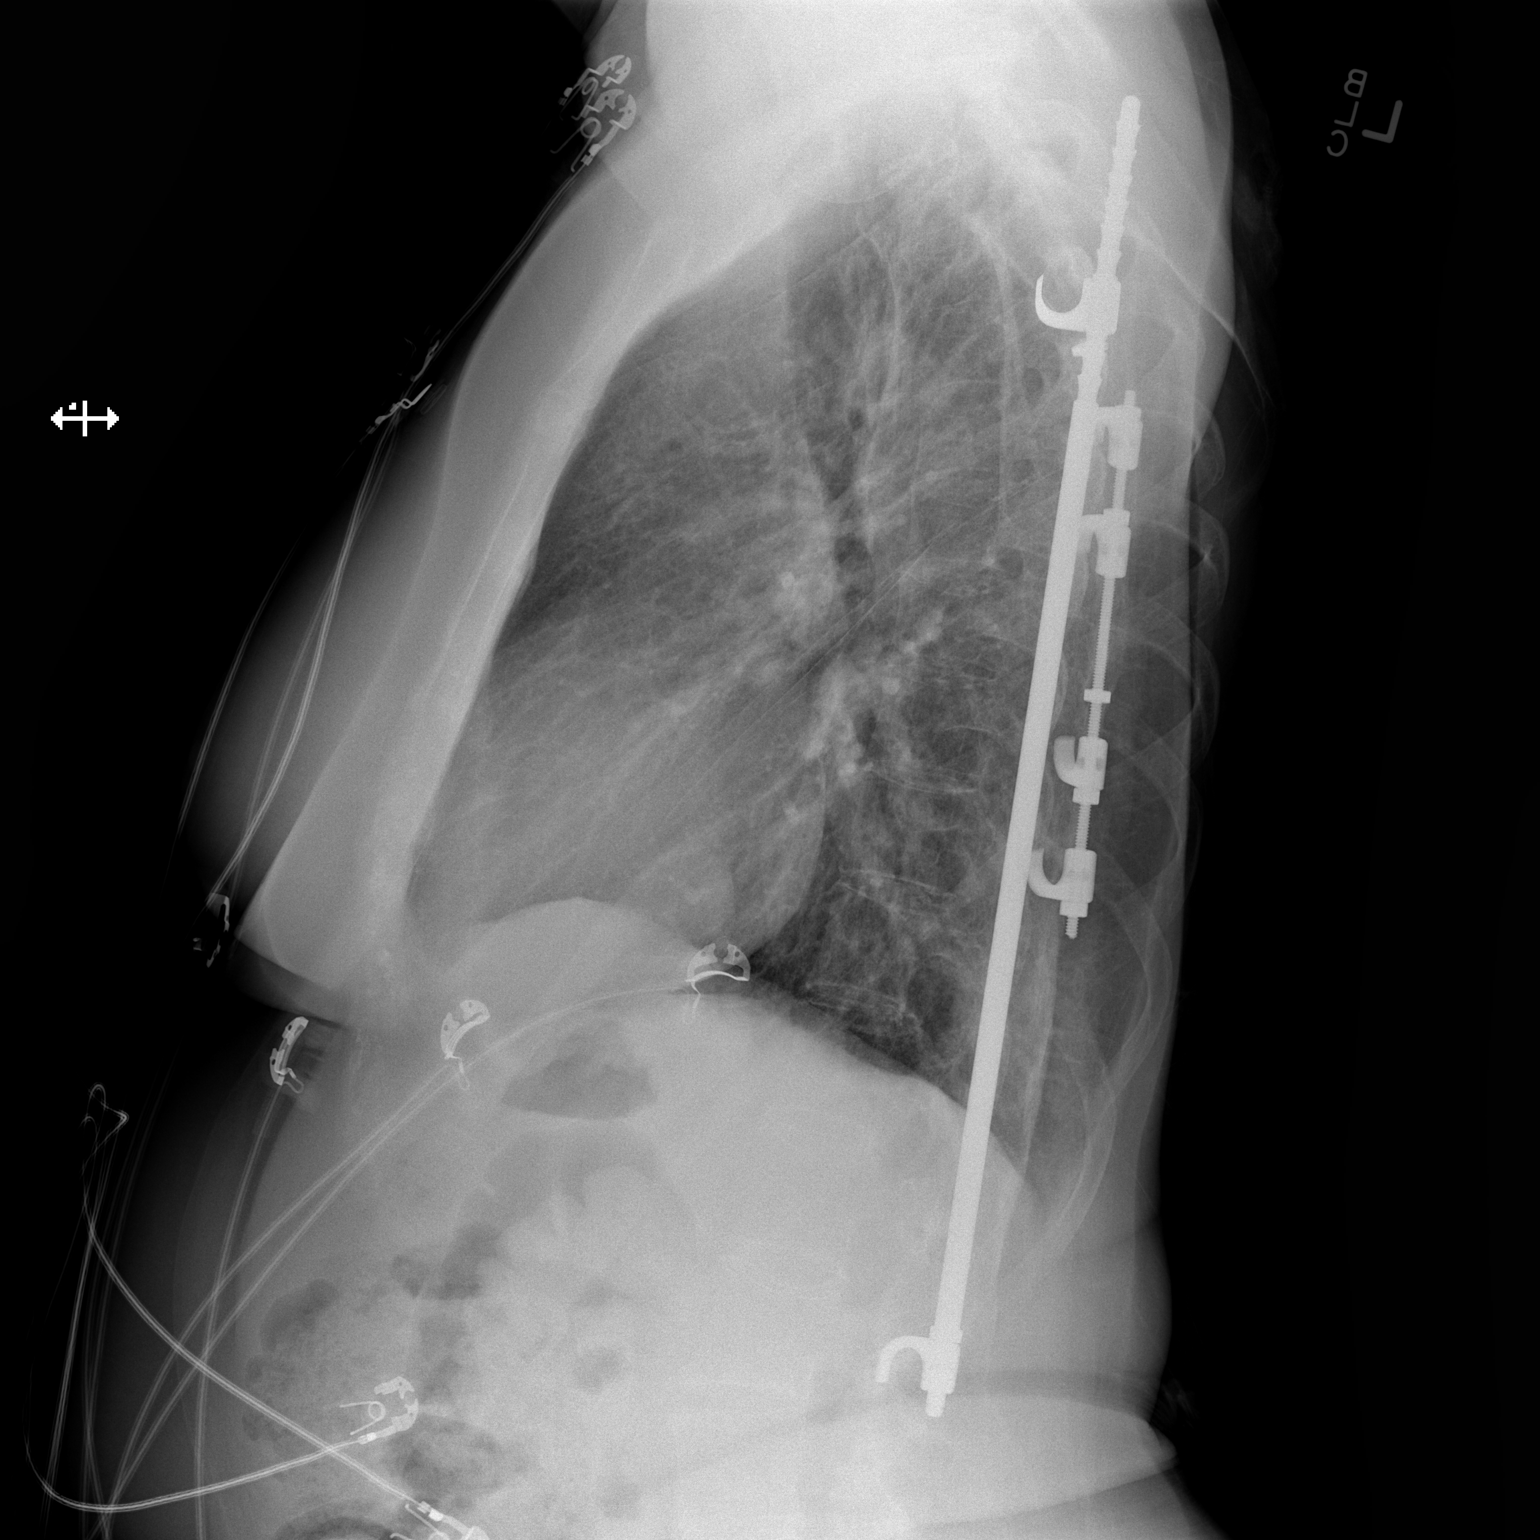

[2 of 2 positions shown; findings below may reference images not displayed]

FINDINGS: Artifact overlies chest.  There is spinal curvature with
rods in place for treatment of scoliosis.  Heart size is normal.
Mediastinal shadows are normal.  Lungs are clear.  No effusions.
No acute bony finding.
IMPRESSION: No active cardiopulmonary disease.  Spinal curvature with previous
corrective surgery.

## 2014-10-31 ENCOUNTER — Ambulatory Visit (AMBULATORY_SURGERY_CENTER): Payer: Self-pay

## 2014-10-31 ENCOUNTER — Telehealth: Payer: Self-pay | Admitting: Internal Medicine

## 2014-10-31 VITALS — Ht 66.0 in | Wt 168.0 lb

## 2014-10-31 DIAGNOSIS — Z1211 Encounter for screening for malignant neoplasm of colon: Secondary | ICD-10-CM

## 2014-10-31 MED ORDER — NA SULFATE-K SULFATE-MG SULF 17.5-3.13-1.6 GM/177ML PO SOLN
1.0000 | Freq: Once | ORAL | Status: DC
Start: 2014-10-31 — End: 2014-11-14

## 2014-10-31 NOTE — Progress Notes (Signed)
No egg or soy allergies Not on home 02 No previous anesthesia complications No diet or weight loss meds Per pt prev colon in [place] >10 yrs ago, unknown md. Pt said exam was normal except hems.

## 2014-10-31 NOTE — Telephone Encounter (Signed)
Pt needs a referral for her insurance to have colonoscopy done in two weeks. She has [employer] [facility] and they require it.

## 2014-11-01 ENCOUNTER — Telehealth: Payer: Self-pay | Admitting: Internal Medicine

## 2014-11-01 NOTE — Telephone Encounter (Signed)
Pt has changed her pcp on insurance card.  Referral for colonoscopy can now be done! Thanks!

## 2014-11-01 NOTE — Telephone Encounter (Signed)
Pt needs to call [facility] to have this doctor name removed from the [facility] portal informed pt of this states she will take care of this , once she has done this our office will process the referral authorization  Provider Name: Donavan Burnet Provider Number: [id number] Phone Number: [phone] Address: [address], [place], [place] [address] Start Date: 03/23/2014 End Date:

## 2014-11-14 ENCOUNTER — Encounter: Payer: Self-pay | Admitting: Internal Medicine

## 2014-11-14 ENCOUNTER — Ambulatory Visit (AMBULATORY_SURGERY_CENTER): Payer: 59 | Admitting: Internal Medicine

## 2014-11-14 VITALS — BP 114/58 | HR 62 | Temp 97.7°F | Resp 20 | Ht 66.0 in | Wt 168.0 lb

## 2014-11-14 DIAGNOSIS — D123 Benign neoplasm of transverse colon: Secondary | ICD-10-CM

## 2014-11-14 DIAGNOSIS — Z1211 Encounter for screening for malignant neoplasm of colon: Secondary | ICD-10-CM | POA: Diagnosis not present

## 2014-11-14 DIAGNOSIS — D122 Benign neoplasm of ascending colon: Secondary | ICD-10-CM

## 2014-11-14 MED ORDER — SODIUM CHLORIDE 0.9 % IV SOLN: 500.0000 mL | INTRAVENOUS | Status: DC

## 2014-11-14 NOTE — Patient Instructions (Signed)
Discharge instructions given. Handout on polyps. Resume previous medications. YOU HAD AN ENDOSCOPIC PROCEDURE TODAY AT THE [facility]:   Refer to the procedure report that was given to you for any specific questions about what was found during the examination.  If the procedure report does not answer your questions, please call your gastroenterologist to clarify.  If you requested that your care partner not be given the details of your procedure findings, then the procedure report has been included in a sealed envelope for you to review at your convenience later.  YOU SHOULD EXPECT: Some feelings of bloating in the abdomen. Passage of more gas than usual.  Walking can help get rid of the air that was put into your GI tract during the procedure and reduce the bloating. If you had a lower endoscopy (such as a colonoscopy or flexible sigmoidoscopy) you may notice spotting of blood in your stool or on the toilet paper. If you underwent a bowel prep for your procedure, you may not have a normal bowel movement for a few days.  Please Note:  You might notice some irritation and congestion in your nose or some drainage.  This is from the oxygen used during your procedure.  There is no need for concern and it should clear up in a day or so.  SYMPTOMS TO REPORT IMMEDIATELY:   Following lower endoscopy (colonoscopy or flexible sigmoidoscopy):  Excessive amounts of blood in the stool  Significant tenderness or worsening of abdominal pains  Swelling of the abdomen that is new, acute  Fever of 100F or higher   For urgent or emergent issues, a gastroenterologist can be reached at any hour by calling [phone].   DIET: Your first meal following the procedure should be a small meal and then it is ok to progress to your normal diet. Heavy or fried foods are harder to digest and may make you feel nauseous or bloated.  Likewise, meals heavy in dairy and vegetables can increase bloating.  Drink  plenty of fluids but you should avoid alcoholic beverages for 24 hours.  ACTIVITY:  You should plan to take it easy for the rest of today and you should NOT DRIVE or use heavy machinery until tomorrow (because of the sedation medicines used during the test).    FOLLOW UP: Our staff will call the number listed on your records the next business day following your procedure to check on you and address any questions or concerns that you may have regarding the information given to you following your procedure. If we do not reach you, we will leave a message.  However, if you are feeling well and you are not experiencing any problems, there is no need to return our call.  We will assume that you have returned to your regular daily activities without incident.  If any biopsies were taken you will be contacted by phone or by letter within the next 1-3 weeks.  Please call us at [phone] if you have not heard about the biopsies in 3 weeks.    SIGNATURES/CONFIDENTIALITY: You and/or your care partner have signed paperwork which will be entered into your electronic medical record.  These signatures attest to the fact that that the information above on your After Visit Summary has been reviewed and is understood.  Full responsibility of the confidentiality of this discharge information lies with you and/or your care-partner. 

## 2014-11-14 NOTE — Progress Notes (Signed)
Called to room to assist during endoscopic procedure.  Patient ID and intended procedure confirmed with present staff. Received instructions for my participation in the procedure from the performing physician.  

## 2014-11-14 NOTE — Progress Notes (Signed)
Report to PACU, RN, vss, BBS= Clear.  

## 2014-11-14 NOTE — Op Note (Signed)
[facility]  [address] [place], [id number]   COLONOSCOPY PROCEDURE REPORT  PATIENT: Savannah, Fowler  MR#: [id number] BIRTHDATE: 08-18-1955 , 30  yrs. old GENDER: female ENDOSCOPIST: Jerene Bears, MD REFERRED WE:[id number] Savannah Fowler, M.D. PROCEDURE DATE:  11/14/2014 PROCEDURE:   Colonoscopy, screening and Colonoscopy with snare polypectomy First Screening Colonoscopy - Avg.  risk and is 50 yrs.  old or older - No.  Prior Negative Screening - Now for repeat screening. 10 or more years since last screening  History of Adenoma - Now for follow-up colonoscopy & has been > or = to 3 yrs.  N/A  Polyps removed today? Yes ASA CLASS:   Class II INDICATIONS:Screening for colonic neoplasia and Colorectal Neoplasm Risk Assessment for this procedure is average risk. MEDICATIONS: Monitored anesthesia care and Propofol 250 mg IV  DESCRIPTION OF PROCEDURE:   After the risks benefits and alternatives of the procedure were thoroughly explained, informed consent was obtained.  The digital rectal exam revealed no rectal mass.   The LB [id number]  endoscope was introduced through the anus and advanced to the cecum, which was identified by both the appendix and ileocecal valve. No adverse events experienced. The quality of the prep was good.  (Suprep was used)  The instrument was then slowly withdrawn as the colon was fully examined. Estimated blood loss is zero unless otherwise noted in this procedure report.  COLON FINDINGS: Two sessile polyps measuring 6 mm in size were found in the ascending colon.  A polypectomy was performed with a cold snare.  The resection was complete, the polyp tissue was completely retrieved and sent to histology.   Two sessile polyps ranging from 6 to 43mm in size with mucous caps were found at the hepatic flexure and in the proximal transverse colon.  Polypectomies were performed with a cold snare.  The resection was complete, the polyp  tissue was completely retrieved and sent to histology.   The examination was otherwise normal.  Retroflexed views revealed no abnormalities. The time to cecum = 3.5 Withdrawal time = 13.4   The scope was withdrawn and the procedure completed. COMPLICATIONS: There were no immediate complications.  ENDOSCOPIC IMPRESSION: 1.   Two sessile polyps were found in the ascending colon; polypectomy was performed with a cold snare 2.   Two sessile polyps ranging from 6 to 47mm in size were found at the hepatic flexure and in the proximal transverse colon; polypectomies were performed with a cold snare 3.   The examination was otherwise normal  RECOMMENDATIONS: 1.  Await pathology results 2.  If the polyps removed today are proven to be adenomatous (pre-cancerous) polyps, you will need a colonoscopy in 3 years. Otherwise you should continue to follow colorectal cancer screening guidelines for "routine risk" patients with a colonoscopy in 10 years.  You will receive a letter within 1-2 weeks with the results of your biopsy as well as final recommendations.  Please call my office if you have not received a letter after 3 weeks. eSigned:  Jerene Bears, MD 11/14/2014 11:30 AM     rcc: [name], MD and The Patient

## 2014-11-15 ENCOUNTER — Telehealth: Payer: Self-pay | Admitting: *Deleted

## 2014-11-15 NOTE — Telephone Encounter (Signed)
No identifier, left message, follow-up  

## 2014-11-21 ENCOUNTER — Encounter: Payer: Self-pay | Admitting: Internal Medicine

## 2015-04-29 ENCOUNTER — Other Ambulatory Visit: Payer: Self-pay

## 2015-04-29 DIAGNOSIS — Z1231 Encounter for screening mammogram for malignant neoplasm of breast: Secondary | ICD-10-CM

## 2015-05-23 ENCOUNTER — Ambulatory Visit
Admission: RE | Admit: 2015-05-23 | Discharge: 2015-05-23 | Disposition: A | Discharge status: Home / Self Care | Payer: BLUE CROSS/BLUE SHIELD | Source: Ambulatory Visit

## 2015-05-23 DIAGNOSIS — Z1231 Encounter for screening mammogram for malignant neoplasm of breast: Secondary | ICD-10-CM

## 2017-08-12 ENCOUNTER — Encounter: Payer: Self-pay | Admitting: *Deleted

## 2017-09-12 ENCOUNTER — Encounter: Payer: Self-pay | Admitting: Internal Medicine
